# Patient Record
Sex: Female | Born: 1990 | Race: Black or African American | Hispanic: No | Marital: Single | Smoking: Current every day smoker
Health system: Southern US, Community
[De-identification: ages and names within clinical notes are randomized; demographics above are authoritative.]

## PROBLEM LIST (undated history)

## (undated) DIAGNOSIS — K219 Gastro-esophageal reflux disease without esophagitis: Secondary | ICD-10-CM

## (undated) DIAGNOSIS — Z794 Long term (current) use of insulin: Secondary | ICD-10-CM

## (undated) DIAGNOSIS — I10 Essential (primary) hypertension: Secondary | ICD-10-CM

## (undated) DIAGNOSIS — E119 Type 2 diabetes mellitus without complications: Secondary | ICD-10-CM

## (undated) DIAGNOSIS — Z720 Tobacco use: Secondary | ICD-10-CM

## (undated) HISTORY — DX: Type 2 diabetes mellitus without complications: E11.9

## (undated) HISTORY — DX: Long term (current) use of insulin: Z79.4

## (undated) HISTORY — DX: Morbid (severe) obesity due to excess calories: E66.01

## (undated) HISTORY — DX: Gastro-esophageal reflux disease without esophagitis: K21.9

## (undated) HISTORY — DX: Tobacco use: Z72.0

## (undated) HISTORY — DX: Essential (primary) hypertension: I10

---

## 2014-01-22 ENCOUNTER — Emergency Department: Payer: Self-pay | Admitting: Student

## 2016-01-19 ENCOUNTER — Ambulatory Visit: Payer: Self-pay | Admitting: Urology

## 2016-01-19 VITALS — BP 151/102 | HR 80 | Ht 66.0 in | Wt 258.0 lb

## 2016-01-19 DIAGNOSIS — Z0000 Encounter for general adult medical examination without abnormal findings: Secondary | ICD-10-CM

## 2016-01-19 MED ORDER — HYDROCHLOROTHIAZIDE 25 MG PO TABS: 25.0000 mg | ORAL_TABLET | Freq: Every day | ORAL | 0 refills | Status: DC

## 2016-01-19 NOTE — Progress Notes (Signed)
  Patient: Pamela Kemp Female    DOB: 04-13-1990   25 y.o.   MRN: [id number] Visit Date: 01/19/2016  Today's Provider: [facility]   Chief Complaint  Patient presents with  . New Patient (Initial Visit)    pt has not had a physical exam "for a long time", BG: 93   Subjective:    HPI Patient is wanting to establish with a PCP.  She has not had a PAP smear.  No pain with sexual activity, no vaginal discharge or itching.    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  She has not had a seizure since that time.    BP is high today.  She states her maternal grandmother and mother have HTN.  She has not had any chest pain, SOB, no problems with vision, no HA's, numbness or other signs of stroke.  No exercise.  Has cut down on fried foods.  Eats fruits occasionally.  Does not like vegetables.        Allergies  Allergen Reactions  . Amoxicillin    Previous Medications   No medications on file    Review of Systems  Social History  Substance Use Topics  . Smoking status: Current Every Day Smoker    Years: 8.00    Types: Cigars  . Smokeless tobacco: Never Used  . Alcohol use No   Objective:   BP (!) 151/102   Pulse 80   Ht 5\' 6"  (1.676 m)   Wt 258 lb (117 kg)   LMP  (Within Months) Comment: mid october  SpO2 96%   BMI 41.64 kg/m   Physical Exam Constitutional: Well nourished. Alert and oriented, No acute distress. HEENT: [place] AT, moist mucus membranes. Trachea midline, no masses. Cardiovascular: No clubbing, cyanosis, or edema. Respiratory: Normal respiratory effort, no increased work of breathing. GI: Abdomen is soft, non tender, non distended, no abdominal masses. Liver and spleen not palpable.  No hernias appreciated.  Stool sample for occult testing is not indicated.   GU: No CVA tenderness.  No bladder fullness or masses.   Skin: No rashes, bruises  or suspicious lesions. Lymph: No cervical or inguinal adenopathy. Neurologic: Grossly intact, no focal deficits, moving all 4 extremities. Psychiatric: Normal mood and affect.      Assessment & Plan:  1. HTN  - start HCTZ  - RTC in 2 weeks  - not pregnant in a relationship with female partner 2. Health maintenance  - obtain screening labs (HbgA1c, TSH, CMP, CBC, lipids) 3. Obese  - encourage exercise - 30 minutes every day  - given DASH diet  Flu shot given in the left deltiod lot #[id number]  Exp 07/2016         [facility]   [facility] [place]

## 2016-01-19 NOTE — Patient Instructions (Addendum)
DASH Eating Plan DASH stands for "Dietary Approaches to Stop Hypertension." The DASH eating plan is a healthy eating plan that has been shown to reduce high blood pressure (hypertension). Additional health benefits may include reducing the risk of type 2 diabetes mellitus, heart disease, and stroke. The DASH eating plan may also help with weight loss. What do I need to know about the DASH eating plan? For the DASH eating plan, you will follow these general guidelines:  Choose foods with less than 150 milligrams of sodium per serving (as listed on the food label).  Use salt-free seasonings or herbs instead of table salt or sea salt.  Check with your health care provider or pharmacist before using salt substitutes.  Eat lower-sodium products. These are often labeled as "low-sodium" or "no salt added."  Eat fresh foods. Avoid eating a lot of canned foods.  Eat more vegetables, fruits, and low-fat dairy products.  Choose whole grains. Look for the word "whole" as the first word in the ingredient list.  Choose fish and skinless chicken or turkey more often than red meat. Limit fish, poultry, and meat to 6 oz (170 g) each day.  Limit sweets, desserts, sugars, and sugary drinks.  Choose heart-healthy fats.  Eat more home-cooked food and less restaurant, buffet, and fast food.  Limit fried foods.  Do not fry foods. Cook foods using methods such as baking, boiling, grilling, and broiling instead.  When eating at a restaurant, ask that your food be prepared with less salt, or no salt if possible. What foods can I eat? Seek help from a dietitian for individual calorie needs. Grains  Whole grain or whole wheat bread. Brown rice. Whole grain or whole wheat pasta. Quinoa, bulgur, and whole grain cereals. Low-sodium cereals. Corn or whole wheat flour tortillas. Whole grain cornbread. Whole grain crackers. Low-sodium crackers. Vegetables  Fresh or frozen vegetables (raw, steamed, roasted, or  grilled). Low-sodium or reduced-sodium tomato and vegetable juices. Low-sodium or reduced-sodium tomato sauce and paste. Low-sodium or reduced-sodium canned vegetables. Fruits  All fresh, canned (in natural juice), or frozen fruits. Meat and Other Protein Products  Ground beef (85% or leaner), grass-fed beef, or beef trimmed of fat. Skinless chicken or turkey. Ground chicken or turkey. Pork trimmed of fat. All fish and seafood. Eggs. Dried beans, peas, or lentils. Unsalted nuts and seeds. Unsalted canned beans. Dairy  Low-fat dairy products, such as skim or 1% milk, 2% or reduced-fat cheeses, low-fat ricotta or cottage cheese, or plain low-fat yogurt. Low-sodium or reduced-sodium cheeses. Fats and Oils  Tub margarines without trans fats. Light or reduced-fat mayonnaise and salad dressings (reduced sodium). Avocado. Safflower, olive, or canola oils. Natural peanut or almond butter. Other  Unsalted popcorn and pretzels. The items listed above may not be a complete list of recommended foods or beverages. Contact your dietitian for more options.  What foods are not recommended? Grains  White bread. White pasta. White rice. Refined cornbread. Bagels and croissants. Crackers that contain trans fat. Vegetables  Creamed or fried vegetables. Vegetables in a cheese sauce. Regular canned vegetables. Regular canned tomato sauce and paste. Regular tomato and vegetable juices. Fruits  Canned fruit in light or heavy syrup. Fruit juice. Meat and Other Protein Products  Fatty cuts of meat. Ribs, chicken wings, bacon, sausage, bologna, salami, chitterlings, fatback, hot dogs, bratwurst, and packaged luncheon meats. Salted nuts and seeds. Canned beans with salt. Dairy  Whole or 2% milk, cream, half-and-half, and cream cheese. Whole-fat or sweetened yogurt. Full-fat cheeses   or blue cheese. Nondairy creamers and whipped toppings. Processed cheese, cheese spreads, or cheese curds. Condiments  Onion and garlic  salt, seasoned salt, table salt, and sea salt. Canned and packaged gravies. Worcestershire sauce. Tartar sauce. Barbecue sauce. Teriyaki sauce. Soy sauce, including reduced sodium. Steak sauce. Fish sauce. Oyster sauce. Cocktail sauce. Horseradish. Ketchup and mustard. Meat flavorings and tenderizers. Bouillon cubes. Hot sauce. Tabasco sauce. Marinades. Taco seasonings. Relishes. Fats and Oils  Butter, stick margarine, lard, shortening, ghee, and bacon fat. Coconut, palm kernel, or palm oils. Regular salad dressings. Other  Pickles and olives. Salted popcorn and pretzels. The items listed above may not be a complete list of foods and beverages to avoid. Contact your dietitian for more information.  Where can I find more information? National Heart, Lung, and Blood Institute: www.nhlbi.nih.gov/health/health-topics/topics/dash/ This information is not intended to replace advice given to you by your health care provider. Make sure you discuss any questions you have with your health care provider. Document Released: 01/25/2011 Document Revised: 07/14/2015 Document Reviewed: 12/10/2012 Elsevier Interactive Patient Education  2017 Elsevier Inc. Hypertension Hypertension is another name for high blood pressure. High blood pressure forces your heart to work harder to pump blood. A blood pressure reading has two numbers, which includes a higher number over a lower number (example: 110/72). Follow these instructions at home:  Have your blood pressure rechecked by your doctor.  Only take medicine as told by your doctor. Follow the directions carefully. The medicine does not work as well if you skip doses. Skipping doses also puts you at risk for problems.  Do not smoke.  Monitor your blood pressure at home as told by your doctor. Contact a doctor if:  You think you are having a reaction to the medicine you are taking.  You have repeat headaches or feel dizzy.  You have puffiness (swelling) in your  ankles.  You have trouble with your vision. Get help right away if:  You get a very bad headache and are confused.  You feel weak, numb, or faint.  You get chest or belly (abdominal) pain.  You throw up (vomit).  You cannot breathe very well. This information is not intended to replace advice given to you by your health care provider. Make sure you discuss any questions you have with your health care provider. Document Released: 07/25/2007 Document Revised: 07/14/2015 Document Reviewed: 11/28/2012 Elsevier Interactive Patient Education  2017 Elsevier Inc.  

## 2016-01-26 ENCOUNTER — Other Ambulatory Visit: Payer: Self-pay

## 2016-01-26 DIAGNOSIS — I10 Essential (primary) hypertension: Secondary | ICD-10-CM

## 2016-01-27 LAB — CBC WITH DIFFERENTIAL/PLATELET
BASOS: 0 %
Basophils Absolute: 0 10*3/uL (ref 0.0–0.2)
EOS (ABSOLUTE): 0 10*3/uL (ref 0.0–0.4)
Eos: 1 %
HEMATOCRIT: 40.9 % (ref 34.0–46.6)
HEMOGLOBIN: 13.1 g/dL (ref 11.1–15.9)
IMMATURE GRANS (ABS): 0 10*3/uL (ref 0.0–0.1)
Immature Granulocytes: 0 %
LYMPHS: 57 %
Lymphocytes Absolute: 2.6 10*3/uL (ref 0.7–3.1)
MCH: 26.3 pg — AB (ref 26.6–33.0)
MCHC: 32 g/dL (ref 31.5–35.7)
MCV: 82 fL (ref 79–97)
MONOCYTES: 5 %
Monocytes Absolute: 0.3 10*3/uL (ref 0.1–0.9)
NEUTROS ABS: 1.7 10*3/uL (ref 1.4–7.0)
Neutrophils: 37 %
Platelets: 186 10*3/uL (ref 150–379)
RBC: 4.98 x10E6/uL (ref 3.77–5.28)
RDW: 14.5 % (ref 12.3–15.4)
WBC: 4.6 10*3/uL (ref 3.4–10.8)

## 2016-01-27 LAB — LIPID PANEL
CHOLESTEROL TOTAL: 166 mg/dL (ref 100–199)
Chol/HDL Ratio: 5 ratio units — ABNORMAL HIGH (ref 0.0–4.4)
HDL: 33 mg/dL — AB (ref >39)
LDL Calculated: 91 mg/dL (ref 0–99)
TRIGLYCERIDES: 210 mg/dL — AB (ref 0–149)
VLDL CHOLESTEROL CAL: 42 mg/dL — AB (ref 5–40)

## 2016-01-27 LAB — COMPREHENSIVE METABOLIC PANEL
A/G RATIO: 1.1 — AB (ref 1.2–2.2)
ALT: 65 IU/L — AB (ref 0–32)
AST: 49 IU/L — ABNORMAL HIGH (ref 0–40)
Albumin: 4.2 g/dL (ref 3.5–5.5)
Alkaline Phosphatase: 77 IU/L (ref 39–117)
BUN/Creatinine Ratio: 16 (ref 9–23)
BUN: 10 mg/dL (ref 6–20)
Bilirubin Total: 0.3 mg/dL (ref 0.0–1.2)
CALCIUM: 9.6 mg/dL (ref 8.7–10.2)
CO2: 23 mmol/L (ref 18–29)
Chloride: 102 mmol/L (ref 96–106)
Creatinine, Ser: 0.64 mg/dL (ref 0.57–1.00)
GFR, EST AFRICAN AMERICAN: 143 mL/min/{1.73_m2} (ref >59)
GFR, EST NON AFRICAN AMERICAN: 124 mL/min/{1.73_m2} (ref >59)
Globulin, Total: 3.9 g/dL (ref 1.5–4.5)
Glucose: 110 mg/dL — ABNORMAL HIGH (ref 65–99)
POTASSIUM: 4.5 mmol/L (ref 3.5–5.2)
Sodium: 141 mmol/L (ref 134–144)
TOTAL PROTEIN: 8.1 g/dL (ref 6.0–8.5)

## 2016-01-27 LAB — HEMOGLOBIN A1C
ESTIMATED AVERAGE GLUCOSE: 146 mg/dL
Hgb A1c MFr Bld: 6.7 % — ABNORMAL HIGH (ref 4.8–5.6)

## 2016-01-27 LAB — TSH: TSH: 1.23 u[IU]/mL (ref 0.450–4.500)

## 2016-02-01 ENCOUNTER — Ambulatory Visit: Payer: Self-pay | Admitting: Ophthalmology

## 2016-02-02 ENCOUNTER — Ambulatory Visit: Payer: Self-pay | Admitting: Urology

## 2016-02-02 VITALS — BP 139/90 | HR 89 | Temp 98.9°F | Wt 253.0 lb

## 2016-02-02 DIAGNOSIS — I10 Essential (primary) hypertension: Secondary | ICD-10-CM

## 2016-02-02 DIAGNOSIS — E119 Type 2 diabetes mellitus without complications: Secondary | ICD-10-CM

## 2016-02-02 MED ORDER — METFORMIN HCL ER 500 MG PO TB24: 500.0000 mg | ORAL_TABLET | Freq: Every day | ORAL | 3 refills | Status: DC

## 2016-02-02 MED ORDER — HYDROCHLOROTHIAZIDE 25 MG PO TABS: 25.0000 mg | ORAL_TABLET | Freq: Every day | ORAL | 3 refills | Status: DC

## 2016-02-02 NOTE — Progress Notes (Signed)
Patient: Pamela Kemp Female    DOB: 1990-07-04   25 y.o.   MRN: [id number] Visit Date: 02/02/2016  Today's Provider: [facility]   Chief Complaint  Patient presents with  . Hypertension    follow up and bp check.    Subjective:    Hypertension  Pertinent negatives include no chest pain or headaches.   Patient is wanting to establish with a PCP.  She has not had a PAP smear.  No pain with sexual activity, no vaginal discharge or itching.    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  She has not had a seizure since that time.    BP is lower at today's vsit.  Last visit 151/102, today is 139/90.  She states her maternal grandmother and mother have HTN.  She has not had any chest pain, SOB, no problems with vision, no HA's, numbness or other signs of stroke.  Was able to walk 30 minutes every day last week.    Has cut down on fried foods.  Eats fruits occasionally.  Eating more vegetables.    She was found to have a HbgA1c at 6.7%.  Had eye exam on 02/02/2016     Allergies  Allergen Reactions  . Amoxicillin    Previous Medications   HYDROCHLOROTHIAZIDE (HYDRODIURIL) 25 MG TABLET    Take 1 tablet (25 mg total) by mouth daily.    Review of Systems  Constitutional: Positive for activity change. Negative for appetite change and chills.  HENT: Negative for congestion, dental problem and drooling.   Eyes: Negative for pain, discharge and itching.  Respiratory: Negative for apnea, choking and chest tightness.   Cardiovascular: Negative for chest pain and leg swelling.  Gastrointestinal: Negative for abdominal distention, abdominal pain and anal bleeding.  Endocrine: Negative for cold intolerance, heat intolerance and polydipsia.  Genitourinary: Negative for difficulty urinating, dyspareunia and dysuria.  Musculoskeletal: Negative for arthralgias, back pain and  gait problem.  Allergic/Immunologic: Negative for environmental allergies, food allergies and immunocompromised state.  Neurological: Negative for dizziness, facial asymmetry, light-headedness and headaches.  Hematological: Negative for adenopathy. Does not bruise/bleed easily.  Psychiatric/Behavioral: Negative for agitation and behavioral problems.    Social History  Substance Use Topics  . Smoking status: Current Every Day Smoker    Years: 8.00    Types: Cigars  . Smokeless tobacco: Never Used  . Alcohol use No   Objective:   BP 139/90   Pulse 89   Temp 98.9 F (37.2 C) (Oral)   Wt 253 lb (114.8 kg)   LMP 01/26/2016 (Approximate) Comment: first week of Dec.  BMI 40.84 kg/m   Physical Exam Constitutional: Obese. Well nourished. Alert and oriented, No acute distress. HEENT: [place] AT, moist mucus membranes. Trachea midline, no masses. Cardiovascular: No clubbing, cyanosis, or edema. Respiratory: Normal respiratory effort, no increased work of breathing. GI: Abdomen is soft, non tender, non distended, no abdominal masses. Liver and spleen not palpable.  No hernias appreciated.  Stool sample for occult testing is not indicated.   GU: No CVA tenderness.  No bladder fullness or masses.   Skin: No rashes, bruises or suspicious lesions. Lymph: No cervical or inguinal adenopathy. Neurologic: Grossly intact, no focal deficits, moving all 4 extremities. Psychiatric: Normal mood and affect.      Assessment & Plan:  1. HTN  - start HCTZ  -  B/P controlled; continue the HCTZ  - not pregnant in a relationship with female partner  2. DM  - patient started on metformin XR 500 mg   - [facility]  - RTC in one month  3. Obese  - encourage exercise - 30 minutes every day  - given DASH diet  [facility]   [facility] [place]

## 2016-02-10 ENCOUNTER — Telehealth: Payer: Self-pay

## 2016-02-10 NOTE — Telephone Encounter (Signed)
Called pt and gave results from [facility]. Pt verbalized understanding. Has appt already 12/28.

## 2016-02-10 NOTE — Telephone Encounter (Signed)
-----   Message from Jacelyn Pieah Doles-Johnson, NP sent at 01/31/2016  8:20 PM EST ----- Pt is diabetic. Make appointment for pt to come in to review labs and start medications.

## 2016-02-16 ENCOUNTER — Ambulatory Visit: Payer: Self-pay | Admitting: Adult Health Nurse Practitioner

## 2016-02-16 VITALS — BP 146/99 | HR 105 | Temp 98.0°F | Wt 258.0 lb

## 2016-02-16 DIAGNOSIS — E119 Type 2 diabetes mellitus without complications: Secondary | ICD-10-CM

## 2016-02-16 DIAGNOSIS — Z794 Long term (current) use of insulin: Secondary | ICD-10-CM

## 2016-02-16 HISTORY — DX: Long term (current) use of insulin: Z79.4

## 2016-02-16 HISTORY — DX: Type 2 diabetes mellitus without complications: E11.9

## 2016-02-16 LAB — POCT GLUCOSE (DEVICE FOR HOME USE): Glucose Fasting, POC: 122 mg/dL — AB (ref 70–99)

## 2016-02-16 NOTE — Progress Notes (Signed)
  Patient: Pamela Kemp Female    DOB: Mar 11, 1990   25 y.o.   MRN: [id number] Visit Date: 02/16/2016  Today's Provider: [facility]   Chief Complaint  Patient presents with  . paperwork    for plasma  . Diabetes   Subjective:    HPI     Needs paperwork filled out for plasma donations with pertinent medical diagnosis.   Reports taking medications as directed.   Allergies  Allergen Reactions  . Amoxicillin    Previous Medications   HYDROCHLOROTHIAZIDE (HYDRODIURIL) 25 MG TABLET    Take 1 tablet (25 mg total) by mouth daily.   METFORMIN (GLUCOPHAGE XR) 500 MG 24 HR TABLET    Take 1 tablet (500 mg total) by mouth daily with breakfast.    Review of Systems  All other systems reviewed and are negative.   Social History  Substance Use Topics  . Smoking status: Current Every Day Smoker    Years: 8.00    Types: Cigars  . Smokeless tobacco: Never Used  . Alcohol use No   Objective:   BP (!) 146/99 (BP Location: Left Arm, Patient Position: Sitting, Cuff Size: Large)   Pulse (!) 105   Temp 98 F (36.7 C) (Oral)   Wt 258 lb (117 kg)   LMP 01/26/2016 (Approximate) Comment: first week of Dec.  BMI 41.64 kg/m   Physical Exam  Constitutional: She is oriented to person, place, and time. She appears well-developed and well-nourished.  HENT:  Head: Normocephalic and atraumatic.  Eyes: Pupils are equal, round, and reactive to light.  Cardiovascular: Normal rate, regular rhythm and normal heart sounds.   Pulmonary/Chest: Effort normal and breath sounds normal.  Abdominal: Soft. Bowel sounds are normal.  Neurological: She is alert and oriented to person, place, and time.  Skin: Skin is warm and dry.  Vitals reviewed.       Assessment & Plan:         Pt to FU in January for DM and HTN.  BP continues to be elevated today. Will reevaluate at next OV.  Encouraged diet modification and exercise.   Will have paper filled out within a week.    [facility]   [facility] [place]

## 2016-03-08 ENCOUNTER — Ambulatory Visit: Payer: Self-pay

## 2016-03-13 ENCOUNTER — Ambulatory Visit: Payer: Self-pay | Admitting: Adult Health Nurse Practitioner

## 2016-03-13 VITALS — BP 138/98 | HR 96 | Temp 97.1°F | Wt 354.0 lb

## 2016-03-13 DIAGNOSIS — E119 Type 2 diabetes mellitus without complications: Secondary | ICD-10-CM

## 2016-03-13 DIAGNOSIS — K219 Gastro-esophageal reflux disease without esophagitis: Secondary | ICD-10-CM

## 2016-03-13 DIAGNOSIS — Z794 Long term (current) use of insulin: Principal | ICD-10-CM

## 2016-03-13 HISTORY — DX: Gastro-esophageal reflux disease without esophagitis: K21.9

## 2016-03-13 LAB — POCT GLUCOSE (DEVICE FOR HOME USE): POC Glucose: 176 mg/dl — AB (ref 70–99)

## 2016-03-13 MED ORDER — AMLODIPINE BESYLATE 5 MG PO TABS: 5.0000 mg | ORAL_TABLET | Freq: Every day | ORAL | 3 refills | Status: DC

## 2016-03-13 MED ORDER — GLIPIZIDE 5 MG PO TABS: 5.0000 mg | ORAL_TABLET | Freq: Every day | ORAL | 2 refills | Status: DC

## 2016-03-13 NOTE — Progress Notes (Signed)
  Patient: Pamela Kemp Female    DOB: 01-17-1991   25 y.o.   MRN: [id number] Visit Date: 03/13/2016  Today's Provider: Jacelyn Pieah Doles-Johnson, NP   Chief Complaint  Patient presents with  . Medication Reaction    metformin makes pt feel sick   . Heartburn   Subjective:    HPI  BP still elevated.  Pt states that she checks BP at home but does not remember readings.  Endorses low sodium diet.    States that Metformin is giving diarrhea and nausea.  She has been on the medication for 2 months.  Pt states that she just feels "sick" all the time after taking the medication. Not checking sugar at home.  A1C 6.7.    States she is having heartburn all the time.    Allergies  Allergen Reactions  . Amoxicillin    Previous Medications   HYDROCHLOROTHIAZIDE (HYDRODIURIL) 25 MG TABLET    Take 1 tablet (25 mg total) by mouth daily.   METFORMIN (GLUCOPHAGE XR) 500 MG 24 HR TABLET    Take 1 tablet (500 mg total) by mouth daily with breakfast.    Review of Systems  All other systems reviewed and are negative.   Social History  Substance Use Topics  . Smoking status: Current Every Day Smoker    Years: 8.00    Types: Cigars  . Smokeless tobacco: Never Used  . Alcohol use No   Objective:   BP (!) 138/98   Pulse 96   Temp 97.1 F (36.2 C)   Wt (!) 354 lb (160.6 kg)   BMI 57.14 kg/m   Physical Exam  Constitutional: She is oriented to person, place, and time. She appears well-developed and well-nourished.  HENT:  Head: Normocephalic and atraumatic.  Cardiovascular: Normal rate, regular rhythm and normal heart sounds.   Pulmonary/Chest: Effort normal and breath sounds normal.  Abdominal: Soft. Bowel sounds are normal.  Neurological: She is alert and oriented to person, place, and time.  Skin: Skin is warm and dry.  Vitals reviewed.       Assessment & Plan:         HTN:  Add Norvasc 5 mg daily.  Continue HCTZ.  FU in 1 month for BP check.   DM:  Stop  Metformin.  Start glipizide 5mg .  Discussed hypoglycemia.   GERD:  Zantac samples given.   FU in 4 weeks for DM and BP medication changes.    Jacelyn Pieah Doles-Johnson, NP   [facility] [place] [place]

## 2016-04-03 ENCOUNTER — Ambulatory Visit: Payer: Self-pay | Admitting: Family Medicine

## 2016-04-03 VITALS — BP 147/104 | HR 89 | Wt 361.0 lb

## 2016-04-03 DIAGNOSIS — Z794 Long term (current) use of insulin: Secondary | ICD-10-CM

## 2016-04-03 DIAGNOSIS — I10 Essential (primary) hypertension: Secondary | ICD-10-CM | POA: Insufficient documentation

## 2016-04-03 DIAGNOSIS — M2142 Flat foot [pes planus] (acquired), left foot: Secondary | ICD-10-CM

## 2016-04-03 DIAGNOSIS — E119 Type 2 diabetes mellitus without complications: Secondary | ICD-10-CM

## 2016-04-03 DIAGNOSIS — Z720 Tobacco use: Secondary | ICD-10-CM

## 2016-04-03 DIAGNOSIS — M79605 Pain in left leg: Principal | ICD-10-CM

## 2016-04-03 DIAGNOSIS — M79604 Pain in right leg: Secondary | ICD-10-CM | POA: Insufficient documentation

## 2016-04-03 DIAGNOSIS — M79672 Pain in left foot: Principal | ICD-10-CM

## 2016-04-03 DIAGNOSIS — M79671 Pain in right foot: Principal | ICD-10-CM

## 2016-04-03 DIAGNOSIS — M2140 Flat foot [pes planus] (acquired), unspecified foot: Secondary | ICD-10-CM | POA: Insufficient documentation

## 2016-04-03 DIAGNOSIS — M2141 Flat foot [pes planus] (acquired), right foot: Secondary | ICD-10-CM

## 2016-04-03 HISTORY — DX: Morbid (severe) obesity due to excess calories: E66.01

## 2016-04-03 LAB — GLUCOSE, POCT (MANUAL RESULT ENTRY): POC Glucose: 83 mg/dl (ref 70–99)

## 2016-04-03 MED ORDER — BLOOD GLUCOSE MONITOR KIT: PACK | 0 refills | Status: AC

## 2016-04-03 MED ORDER — AMLODIPINE BESYLATE 10 MG PO TABS: 10.0000 mg | ORAL_TABLET | Freq: Every day | ORAL | 5 refills | Status: DC

## 2016-04-03 NOTE — Progress Notes (Signed)
BP (!) 147/104   Pulse 89   Wt (!) 361 lb (163.7 kg)   BMI 58.27 kg/m    Subjective:    Patient ID: Pamela Kemp, female    DOB: [birth year], 26 y.o.   MRN: [id number]  HPI: Pamela Kemp is a 26 y.o. female  No chief complaint on file.  Has been having issues with leg pain; going on for months; one ankle, then the other; can be at the same time; laying down helps the pain; standing on it makes it worse; up and down stairs is worse too; she has tried icy hot on her ankles; it will work for a few hours; pain can be there even first thing in the morning; few episodes of sprained ankles; no serious injuries; no xrays done  HTN; taking her BP; mother helping and checking her numbers; she will start writing it down; did have low pressure last month; not adding salt to food; avoiding decongestants  Relevant past medical, surgical, family and social history reviewed Past Medical History:  Diagnosis Date  . Gastroesophageal reflux disease without esophagitis 03/13/2016  . Morbid obesity ([place]) 04/03/2016  . Tobacco use 04/08/2016  . Type 2 diabetes mellitus without complication, with long-term current use of insulin ([place]) 02/16/2016    History reviewed. No pertinent surgical history.  No surgeries Family History  Problem Relation Age of Onset  . Hypertension Mother   . Diabetes Mother   . Multiple sclerosis Father     Social History  Substance Use Topics  . Smoking status: Current Every Day Smoker    Years: 8.00    Types: Cigars  . Smokeless tobacco: Never Used  . Alcohol use No   Interim medical history since last visit reviewed. Allergies and medications reviewed  Review of Systems Per HPI unless specifically indicated above     Objective:    BP (!) 147/104   Pulse 89   Wt (!) 361 lb (163.7 kg)   BMI 58.27 kg/m   Wt Readings from Last 3 Encounters:  04/03/16 (!) 361 lb (163.7 kg)  03/13/16 (!) 354 lb (160.6 kg)  02/16/16 258 lb (117 kg)    Physical  Exam  Constitutional: She appears well-developed and well-nourished.  Morbid obesity; significant recent weight gain over last several months noted  HENT:  Mouth/Throat: Mucous membranes are normal.  Eyes: EOM are normal. No scleral icterus.  Cardiovascular: Normal rate and regular rhythm.   Pulmonary/Chest: Effort normal and breath sounds normal.  Musculoskeletal:       Right foot: There is deformity (pes planus).       Left foot: There is deformity (pes planus).  Psychiatric: She has a normal mood and affect. Her behavior is normal.   Results for orders placed or performed in visit on 04/03/16  POCT Glucose (CBG)  Result Value Ref Range   POC Glucose 83 70 - 99 mg/dl      Assessment & Plan:   Problem List Items Addressed This Visit      Cardiovascular and Mediastinum   Benign hypertension    Increase CCB; try DASH guidelines; weight loss important; see AVS      Relevant Medications   amLODipine (NORVASC) 10 MG tablet     Endocrine   Type 2 diabetes mellitus without complication, with long-term current use of insulin ([facility])   Relevant Orders   POCT Glucose (CBG) (Completed)     Other   Tobacco use    Encouraged her to  quit smoking      Pes planus    Refer to podiatrist      Relevant Orders   Ambulatory referral to Podiatry   Morbid obesity ([place])    I am here to help; encouraged her to move; stay hydrated; encouragement given; see AVS; patient seen too late this evening to get labs; I recommend checking thyroid stimulating hormone at next visit if not recently done      Bilateral leg and foot pain - Primary    Refer to podiatrist; I am sure that her morbid obesity plays a role in her foot pain; encouraged weight loss      Relevant Orders   Ambulatory referral to Podiatry      Follow up plan: Return in about 3 weeks (around 04/24/2016) for blood pressure.  An after-visit summary was printed and given to the patient at [facility].  Please see the patient  instructions which may contain other information and recommendations beyond what is mentioned above in the assessment and plan.  Meds ordered this encounter  Medications  . amLODipine (NORVASC) 10 MG tablet    Sig: Take 1 tablet (10 mg total) by mouth daily.    Dispense:  30 tablet    Refill:  5  . blood glucose meter kit and supplies KIT    Sig: Dispense based on patient and insurance preference. Use one time daily as directed.; E11.9    Dispense:  1 each    Refill:  0    Order Specific Question:   Number of strips    Answer:   100    Order Specific Question:   Number of lancets    Answer:   100    Orders Placed This Encounter  Procedures  . Ambulatory referral to Podiatry  . POCT Glucose (CBG)

## 2016-04-03 NOTE — Patient Instructions (Addendum)
I do encourage you to quit smoking Call [phone] to sign up for smoking cessation classes You can call 1-800-QUIT-NOW to talk with a smoking cessation coach Check out the information at familydoctor.org entitled "Nutrition for Weight Loss: What You Need to Know about Fad Diets" Try to lose between 1-2 pounds per week by taking in fewer calories and burning off more calories You can succeed by limiting portions, limiting foods dense in calories and fat, becoming more active, and drinking 8 glasses of water a day (64 ounces) Don't skip meals, especially breakfast, as skipping meals may alter your metabolism Do not use over-the-counter weight loss pills or gimmicks that claim rapid weight loss A healthy BMI (or body mass index) is between 18.5 and 24.9 You can calculate your ideal BMI at the NIH website [employer]huhttp://www.nhlbi.nih.gov/health/educational/lose_wt/BMI/bmicalc.htm Please let us know how we can help Wear good supportive shoes Increase amlodipine from 5 to 10 mg daily Check sugars once a day

## 2016-04-03 NOTE — Assessment & Plan Note (Addendum)
Increase CCB; try DASH guidelines; weight loss important; see AVS

## 2016-04-03 NOTE — Assessment & Plan Note (Addendum)
Refer to podiatrist; I am sure that her morbid obesity plays a role in her foot pain; encouraged weight loss

## 2016-04-03 NOTE — Assessment & Plan Note (Signed)
Refer to podiatrist 

## 2016-04-03 NOTE — Assessment & Plan Note (Addendum)
I am here to help; encouraged her to move; stay hydrated; encouragement given; see AVS; patient seen too late this evening to get labs; I recommend checking thyroid stimulating hormone at next visit if not recently done

## 2016-04-08 ENCOUNTER — Encounter: Payer: Self-pay | Admitting: Family Medicine

## 2016-04-08 DIAGNOSIS — Z720 Tobacco use: Secondary | ICD-10-CM

## 2016-04-08 HISTORY — DX: Tobacco use: Z72.0

## 2016-04-08 NOTE — Assessment & Plan Note (Signed)
Encouraged her to quit smoking.  ?

## 2016-04-11 ENCOUNTER — Telehealth: Payer: Self-pay

## 2016-04-11 NOTE — Telephone Encounter (Signed)
Called pt about referral to the lifestyle clinic. Pt is still interested in going. I have emailed the director to let her know she is still interested. I also went over charity care with pt. Pt verbalized understanding.

## 2016-04-17 ENCOUNTER — Ambulatory Visit: Payer: Self-pay

## 2016-04-19 ENCOUNTER — Encounter: Payer: Medicaid Other | Attending: Urology | Admitting: *Deleted

## 2016-04-19 ENCOUNTER — Encounter: Payer: Self-pay | Admitting: *Deleted

## 2016-04-19 VITALS — BP 144/100 | Ht 68.0 in | Wt 361.5 lb

## 2016-04-19 DIAGNOSIS — Z713 Dietary counseling and surveillance: Secondary | ICD-10-CM | POA: Insufficient documentation

## 2016-04-19 DIAGNOSIS — E119 Type 2 diabetes mellitus without complications: Secondary | ICD-10-CM | POA: Insufficient documentation

## 2016-04-19 NOTE — Patient Instructions (Signed)
Check blood sugars 1 x day before breakfast OR 2 hrs after supper every day  Exercise:  Continue walking for  60-120 minutes 3 days a week  Eat 3 meals day, 1-2  snacks a day Space meals 4-6 hours apart Avoid sugar sweetened drinks (sports drinks, juices)  Bring blood sugar records to the next class  Return for classes on:

## 2016-04-19 NOTE — Progress Notes (Signed)
Diabetes Self-Management Education  Visit Type: First/Initial  Appt. Start Time: 1335 Appt. End Time:  1430  04/19/2016  Ms. Pamela Kemp, identified by name and date of birth, is a 26 y.o. female with a diagnosis of Diabetes: Type 2.   ASSESSMENT  Blood pressure (!) 144/100, height 5\' 8"  (1.727 m), weight (!) 361 lb 8 oz (164 kg), last menstrual period 04/05/2016. Body mass index is 54.97 kg/m.      Diabetes Self-Management Education - 04/19/16 1451      Visit Information   Visit Type First/Initial     Initial Visit   Diabetes Type Type 2   Are you taking your medications as prescribed? Yes   Date Diagnosed Nov 2017     Health Coping   How would you rate your overall health? Fair     Psychosocial Assessment   Patient Belief/Attitude about Diabetes Other (comment)  "sometimes tired and don't want to do anything"   Self-care barriers None   Self-management support Doctor's office   Patient Concerns Nutrition/Meal planning;Weight Control;Glycemic Control   Special Needs None   Preferred Learning Style Auditory   Learning Readiness Ready   How often do you need to have someone help you when you read instructions, pamphlets, or other written materials from your doctor or pharmacy? 1 - Never   What is the last grade level you completed in school? 12th     Pre-Education Assessment   Patient understands the diabetes disease and treatment process. Needs Instruction   Patient understands incorporating nutritional management into lifestyle. Needs Instruction   Patient undertands incorporating physical activity into lifestyle. Needs Instruction   Patient understands using medications safely. Needs Instruction   Patient understands monitoring blood glucose, interpreting and using results Needs Review   Patient understands prevention, detection, and treatment of acute complications. Needs Instruction   Patient understands prevention, detection, and treatment of chronic  complications. Needs Instruction   Patient understands how to develop strategies to address psychosocial issues. Needs Instruction   Patient understands how to develop strategies to promote health/change behavior. Needs Instruction     Complications   Last HgB A1C per patient/outside source 6.7 %  01/26/16   How often do you check your blood sugar? 1-2 times/day   Fasting Blood glucose range (mg/dL) 40-981;191-47870-129;130-179  Pt reports FBG's range from 86-169 mg/dL.    Have you had a dilated eye exam in the past 12 months? Yes   Have you had a dental exam in the past 12 months? No  no insurance   Are you checking your feet? No     Dietary Intake   Breakfast egg whites and toast; oatmeal; grits and occasional bacon   Lunch noodles, sandwiches - peanut butter   Dinner baked chicken or fish or pork, green beans, potaotes, corn   Beverage(s) water, Gatorade, occasional diet soda, apple juice     Exercise   Exercise Type Light (walking / raking leaves)   How many days per week to you exercise? 3   How many minutes per day do you exercise? 120   Total minutes per week of exercise 360     Patient Education   Previous Diabetes Education No   Disease state  Definition of diabetes, type 1 and 2, and the diagnosis of diabetes   Nutrition management  Role of diet in the treatment of diabetes and the relationship between the three main macronutrients and blood glucose level;Reviewed blood glucose goals for pre and post meals and  how to evaluate the patients' food intake on their blood glucose level.   Physical activity and exercise  Role of exercise on diabetes management, blood pressure control and cardiac health.   Medications Reviewed patients medication for diabetes, action, purpose, timing of dose and side effects.   Monitoring Purpose and frequency of SMBG.;Taught/discussed recording of test results and interpretation of SMBG.;Identified appropriate SMBG and/or A1C goals.   Chronic complications  Relationship between chronic complications and blood glucose control; Pt denied headache, chest pain, numbness or tingling. Instructed her to dial 911 if symptoms occur due to high BP. She reports [facility] is aware and has changed some medication dosages. She has appt next week.    Psychosocial adjustment Identified and addressed patients feelings and concerns about diabetes     Individualized Goals (developed by patient)   Reducing Risk Improve blood sugars Lose weight     Outcomes   Expected Outcomes Demonstrated interest in learning. Expect positive outcomes   Future DMSE 4-6 wks      Individualized Plan for Diabetes Self-Management Training:   Learning Objective:  Patient will have a greater understanding of diabetes self-management. Patient education plan is to attend individual and/or group sessions per assessed needs and concerns.   Plan:   Patient Instructions  Check blood sugars 1 x day before breakfast OR 2 hrs after supper every day Exercise:  Continue walking for  60-120 minutes 3 days a week Eat 3 meals day, 1-2  snacks a day Space meals 4-6 hours apart Avoid sugar sweetened drinks (sports drinks, juices) Bring blood sugar records to the next class  Expected Outcomes:  Demonstrated interest in learning. Expect positive outcomes  Education material provided:  General Meal Planning Guidelines Simple Meal Plan  If problems or questions, patient to contact team via:  Sharion Settler, RN, CCM, CDE [phone]  Future DSME appointment: 4-6 wks  May 17, 2016 for Diabetes Class 1

## 2016-04-20 ENCOUNTER — Ambulatory Visit: Payer: Medicaid Other | Admitting: *Deleted

## 2016-04-24 ENCOUNTER — Ambulatory Visit: Payer: Self-pay | Admitting: Urology

## 2016-04-24 ENCOUNTER — Ambulatory Visit: Payer: Self-pay | Admitting: Podiatry

## 2016-04-24 VITALS — BP 154/100

## 2016-04-24 DIAGNOSIS — Z0130 Encounter for examination of blood pressure without abnormal findings: Secondary | ICD-10-CM

## 2016-04-24 NOTE — Progress Notes (Unsigned)
No complaints

## 2016-05-01 ENCOUNTER — Ambulatory Visit: Payer: Self-pay

## 2016-05-14 ENCOUNTER — Telehealth: Payer: Self-pay | Admitting: *Deleted

## 2016-05-14 NOTE — Telephone Encounter (Signed)
Phone call to follow up with patient about completing the [facility] paperwork for [facility] so she would not be charged for Diabetes classes. Her first class is Thursday 05/17/16. She reports that she hasn't done that yet. She was seen by us on 04/19/16 and instructed about this paperwork. She reports she will call [facility] and call back if she can't come to class on Thursday.

## 2016-05-15 ENCOUNTER — Ambulatory Visit: Payer: Self-pay | Admitting: Podiatry

## 2016-05-16 ENCOUNTER — Telehealth: Payer: Self-pay | Admitting: *Deleted

## 2016-05-16 NOTE — Telephone Encounter (Signed)
Received voice mail from patient that she would not be coming to Diabetes class tomorrow. Called and spoke with her. She hasn't heard back from [facility] and doesn't have paperwork complete to attend classes without cost. Instructed her to call back once she has talked with [facility] and we can reschedule classes. If no response in a few weeks, will discharge.

## 2016-05-17 ENCOUNTER — Ambulatory Visit: Payer: Medicaid Other

## 2016-05-23 ENCOUNTER — Other Ambulatory Visit: Payer: Self-pay

## 2016-05-23 DIAGNOSIS — I10 Essential (primary) hypertension: Secondary | ICD-10-CM

## 2016-05-23 MED ORDER — HYDROCHLOROTHIAZIDE 25 MG PO TABS: 25.0000 mg | ORAL_TABLET | Freq: Every day | ORAL | 0 refills | Status: DC

## 2016-05-23 MED ORDER — GLIPIZIDE 5 MG PO TABS
5.0000 mg | ORAL_TABLET | Freq: Every day | ORAL | 0 refills | Status: DC
Start: 2016-05-23 — End: 2016-06-26

## 2016-05-24 ENCOUNTER — Ambulatory Visit: Payer: Medicaid Other

## 2016-05-31 ENCOUNTER — Ambulatory Visit: Payer: Medicaid Other

## 2016-06-04 ENCOUNTER — Encounter: Payer: Self-pay | Admitting: *Deleted

## 2016-06-10 ENCOUNTER — Emergency Department: Payer: Medicaid Other

## 2016-06-10 ENCOUNTER — Encounter: Payer: Self-pay | Admitting: Emergency Medicine

## 2016-06-10 ENCOUNTER — Emergency Department
Admission: EM | Admit: 2016-06-10 | Discharge: 2016-06-11 | Disposition: A | Discharge status: Home / Self Care | Payer: Medicaid Other | Attending: Emergency Medicine | Admitting: Emergency Medicine

## 2016-06-10 DIAGNOSIS — E119 Type 2 diabetes mellitus without complications: Secondary | ICD-10-CM | POA: Insufficient documentation

## 2016-06-10 DIAGNOSIS — F17290 Nicotine dependence, other tobacco product, uncomplicated: Secondary | ICD-10-CM | POA: Insufficient documentation

## 2016-06-10 DIAGNOSIS — J328 Other chronic sinusitis: Secondary | ICD-10-CM | POA: Insufficient documentation

## 2016-06-10 DIAGNOSIS — R51 Headache: Secondary | ICD-10-CM

## 2016-06-10 DIAGNOSIS — R519 Headache, unspecified: Secondary | ICD-10-CM

## 2016-06-10 DIAGNOSIS — Z7984 Long term (current) use of oral hypoglycemic drugs: Secondary | ICD-10-CM | POA: Insufficient documentation

## 2016-06-10 DIAGNOSIS — J329 Chronic sinusitis, unspecified: Secondary | ICD-10-CM

## 2016-06-10 DIAGNOSIS — I10 Essential (primary) hypertension: Secondary | ICD-10-CM | POA: Insufficient documentation

## 2016-06-10 LAB — CBC WITH DIFFERENTIAL/PLATELET
Basophils Absolute: 0 10*3/uL (ref 0–0.1)
Basophils Relative: 0 %
Eosinophils Absolute: 0.1 10*3/uL (ref 0–0.7)
Eosinophils Relative: 1 %
HEMATOCRIT: 41.7 % (ref 35.0–47.0)
Hemoglobin: 13.7 g/dL (ref 12.0–16.0)
LYMPHS ABS: 2.5 10*3/uL (ref 1.0–3.6)
LYMPHS PCT: 34 %
MCH: 26.5 pg (ref 26.0–34.0)
MCHC: 32.9 g/dL (ref 32.0–36.0)
MCV: 80.6 fL (ref 80.0–100.0)
MONO ABS: 0.7 10*3/uL (ref 0.2–0.9)
MONOS PCT: 10 %
Neutro Abs: 4.2 10*3/uL (ref 1.4–6.5)
Neutrophils Relative %: 55 %
Platelets: 191 10*3/uL (ref 150–440)
RBC: 5.18 MIL/uL (ref 3.80–5.20)
RDW: 13.9 % (ref 11.5–14.5)
WBC: 7.5 10*3/uL (ref 3.6–11.0)

## 2016-06-10 LAB — URINALYSIS, COMPLETE (UACMP) WITH MICROSCOPIC
Bilirubin Urine: NEGATIVE
GLUCOSE, UA: NEGATIVE mg/dL
Ketones, ur: NEGATIVE mg/dL
NITRITE: NEGATIVE
PH: 6 (ref 5.0–8.0)
PROTEIN: NEGATIVE mg/dL
Specific Gravity, Urine: 1.015 (ref 1.005–1.030)

## 2016-06-10 LAB — COMPREHENSIVE METABOLIC PANEL
ALT: 43 U/L (ref 14–54)
AST: 35 U/L (ref 15–41)
Albumin: 4.1 g/dL (ref 3.5–5.0)
Alkaline Phosphatase: 73 U/L (ref 38–126)
Anion gap: 9 (ref 5–15)
BILIRUBIN TOTAL: 0.5 mg/dL (ref 0.3–1.2)
BUN: 6 mg/dL (ref 6–20)
CHLORIDE: 107 mmol/L (ref 101–111)
CO2: 23 mmol/L (ref 22–32)
CREATININE: 0.61 mg/dL (ref 0.44–1.00)
Calcium: 9.1 mg/dL (ref 8.9–10.3)
Glucose, Bld: 89 mg/dL (ref 65–99)
POTASSIUM: 3.8 mmol/L (ref 3.5–5.1)
Sodium: 139 mmol/L (ref 135–145)
TOTAL PROTEIN: 8.5 g/dL — AB (ref 6.5–8.1)

## 2016-06-10 LAB — PREGNANCY, URINE: Preg Test, Ur: NEGATIVE

## 2016-06-10 MED ORDER — LEVOFLOXACIN 500 MG PO TABS: 500.0000 mg | ORAL_TABLET | Freq: Every day | ORAL | 0 refills | Status: AC

## 2016-06-10 MED ORDER — DIPHENHYDRAMINE HCL 50 MG/ML IJ SOLN
25.0000 mg | Freq: Once | INTRAMUSCULAR | Status: AC
  Administered 2016-06-10: 25 mg via INTRAVENOUS
  Filled 2016-06-10: qty 1

## 2016-06-10 MED ORDER — OXYMETAZOLINE HCL 0.05 % NA SOLN: 2.0000 | Freq: Two times a day (BID) | NASAL | 2 refills | Status: DC

## 2016-06-10 MED ORDER — SODIUM CHLORIDE 0.9 % IV SOLN
Freq: Once | INTRAVENOUS | Status: AC
  Administered 2016-06-10: 21:00:00 via INTRAVENOUS

## 2016-06-10 MED ORDER — KETOROLAC TROMETHAMINE 30 MG/ML IJ SOLN
30.0000 mg | Freq: Once | INTRAMUSCULAR | Status: AC
  Administered 2016-06-10: 30 mg via INTRAVENOUS
  Filled 2016-06-10: qty 1

## 2016-06-10 MED ORDER — IBUPROFEN 800 MG PO TABS: 800.0000 mg | ORAL_TABLET | Freq: Three times a day (TID) | ORAL | 0 refills | Status: DC | PRN

## 2016-06-10 MED ORDER — DEXTROSE 5 % IV SOLN
1.0000 g | Freq: Once | INTRAVENOUS | Status: AC
  Administered 2016-06-11: 1 g via INTRAVENOUS

## 2016-06-10 MED ORDER — METOCLOPRAMIDE HCL 5 MG/ML IJ SOLN
10.0000 mg | Freq: Once | INTRAMUSCULAR | Status: AC
  Administered 2016-06-10: 10 mg via INTRAVENOUS
  Filled 2016-06-10: qty 2

## 2016-06-10 NOTE — ED Triage Notes (Signed)
Pt to rm 10 via EMS from home, report HA x 3 days, described as pressure in front and sides of head.  Reports some sinus congestion.  Reports photosensitivity and sensitivity to sound.  Denies vision changes, numbness, and nausea.  Denies hx of headaches.

## 2016-06-10 NOTE — ED Provider Notes (Signed)
[facility] Emergency Department Provider Note       Time seen: ----------------------------------------- 9:13 PM on 06/10/2016 -----------------------------------------     I have reviewed the triage vital signs and the nursing notes.   HISTORY   Chief Complaint Headache    HPI Pamela Kemp is a 26 y.o. female who presents to the ED for headache for the last 3 days. Patient describes the headache as pressure in the front and sides of her head. She has had some recent sinus congestion and describes light and sound sensitivity. She denies any changes in her vision, numbness, weakness or other complaints. Patient states she does not normally have a history of headaches. She denies fevers, chills.   Past Medical History:  Diagnosis Date  . Gastroesophageal reflux disease without esophagitis 03/13/2016  . Hypertension   . Morbid obesity (HCC) 04/03/2016  . Tobacco use 04/08/2016  . Type 2 diabetes mellitus without complication, with long-term current use of insulin (HCC) 02/16/2016    Patient Active Problem List   Diagnosis Date Noted  . Tobacco use 04/08/2016  . Morbid obesity (HCC) 04/03/2016  . Bilateral leg and foot pain 04/03/2016  . Pes planus 04/03/2016  . Benign hypertension 04/03/2016  . Gastroesophageal reflux disease without esophagitis 03/13/2016  . Type 2 diabetes mellitus without complication, with long-term current use of insulin (HCC) 02/16/2016    History reviewed. No pertinent surgical history.  Allergies Amoxicillin  Social History Social History  Substance Use Topics  . Smoking status: Current Every Day Smoker    Years: 8.00    Types: Cigars  . Smokeless tobacco: Never Used     Comment: 2 cigars per day  . Alcohol use 0.6 oz/week    1 Standard drinks or equivalent per week     Comment: wine coolers   Review of Systems Constitutional: Negative for fever. Cardiovascular: Negative for chest pain. Respiratory:  Negative for shortness of breath. Gastrointestinal: Negative for abdominal pain, vomiting and diarrhea. Genitourinary: Negative for dysuria. Musculoskeletal: Negative for back pain. Skin: Negative for rash. Neurological:Positive for headache  10-point ROS otherwise negative.  ____________________________________________   PHYSICAL EXAM:  VITAL SIGNS: ED Triage Vitals [06/10/16 2044]  Enc Vitals Group     BP (!) 144/93     Pulse Rate 78     Resp 16     Temp 97.9 F (36.6 C)     Temp Source Oral     SpO2 98 %     Weight (!) 349 lb (158.3 kg)     Height  (1.727 m)     Head Circumference      Peak Flow      Pain Score 10     Pain Loc      Pain Edu?      Excl. in GC?     Constitutional: Alert and oriented. Tearful, mild distress Eyes: Conjunctivae are normal. PERRL. Normal extraocular movements. ENT   Head: Normocephalic and atraumatic.   Nose: No congestion/rhinnorhea.   Mouth/Throat: Mucous membranes are moist.   Neck: No stridor. Cardiovascular: Normal rate, regular rhythm. No murmurs, rubs, or gallops. Respiratory: Normal respiratory effort without tachypnea nor retractions. Breath sounds are clear and equal bilaterally. No wheezes/rales/rhonchi. Gastrointestinal: Soft and nontender. Normal bowel sounds Musculoskeletal: Nontender with normal range of motion in extremities. No lower extremity tenderness nor edema. Neurologic:  Normal speech and language. No gross focal neurologic deficits are appreciated.  Skin:  Skin is warm, dry and intact. No rash  noted. Psychiatric: Mood and affect are normal. Speech and behavior are normal.  ____________________________________________  ED COURSE:  Pertinent labs & imaging results that were available during my care of the patient were reviewed by me and considered in my medical decision making (see chart for details). Patient presents for headaches, we will assess with labs and imaging as indicated. Clinical  Course as of Jun 10 2336  Wynelle Link Jun 10, 2016  2247 Patient reports improving headache at this time.   [JW]    Clinical Course User Index [JW] Emily Filbert, MD   Procedures ____________________________________________   LABS (pertinent positives/negatives)  Labs Reviewed  COMPREHENSIVE METABOLIC PANEL - Abnormal; Notable for the following:       Result Value   Total Protein 8.5 (*)    All other components within normal limits  URINALYSIS, COMPLETE (UACMP) WITH MICROSCOPIC - Abnormal; Notable for the following:    Color, Urine YELLOW (*)    APPearance HAZY (*)    Hgb urine dipstick MODERATE (*)    Leukocytes, UA SMALL (*)    Bacteria, UA MANY (*)    Squamous Epithelial / LPF 6-30 (*)    All other components within normal limits  CBC WITH DIFFERENTIAL/PLATELET  PREGNANCY, URINE    RADIOLOGY Images were viewed by me  CT head FINDINGS: Brain: No evidence of acute infarction, hemorrhage, hydrocephalus, extra-axial collection or mass lesion/mass effect.  Vascular: No hyperdense vessel or unexpected calcification.  Skull: Negative for acute fracture or focal lesions.  Sinuses/Orbits: Moderate ethmoid and left maxillary sinus mucosal thickening with mild mucosal thickening of the right maxillary sinus and sphenoid. The frontal sinus is developmentally not pneumatized. Clear mastoids bilaterally.  Other: None.  IMPRESSION: Chronic appearing paranasal sinusitis. No acute intracranial abnormality. ____________________________________________  FINAL ASSESSMENT AND PLAN  Headache, Sinusitis  Plan: Patient's labs and imaging were dictated above. Patient had presented for headache which is likely sinus related. She will be started on Augmentin as well as decongestants and is stable for outpatient ENT follow-up if not improving.   Emily Filbert, MD   Note: This note was generated in part or whole with voice recognition software. Voice recognition is  usually quite accurate but there are transcription errors that can and very often do occur. I apologize for any typographical errors that were not detected and corrected.     Emily Filbert, MD 06/10/16 [phone]

## 2016-06-11 MED ORDER — CEFTRIAXONE SODIUM-DEXTROSE 1-3.74 GM-% IV SOLR
INTRAVENOUS | Status: AC
  Filled 2016-06-11: qty 50

## 2016-06-11 NOTE — ED Notes (Signed)
Pt back from ct

## 2016-06-12 ENCOUNTER — Ambulatory Visit: Payer: Medicaid Other | Admitting: Pharmacy Technician

## 2016-06-12 DIAGNOSIS — Z79899 Other long term (current) drug therapy: Secondary | ICD-10-CM

## 2016-06-12 NOTE — Progress Notes (Signed)
Completed [facility] application and contract.  Patient agreed to all terms of the [facility] contract.  Patient approved to receive medication assistance at [facility] through 2018, as long as eligibility continues to be met.  Provided patient with community resource material based on her particular needs.    [facility]

## 2016-06-20 ENCOUNTER — Encounter: Payer: Self-pay | Admitting: Emergency Medicine

## 2016-06-20 ENCOUNTER — Emergency Department: Payer: Self-pay

## 2016-06-20 ENCOUNTER — Emergency Department
Admission: EM | Admit: 2016-06-20 | Discharge: 2016-06-20 | Disposition: A | Discharge status: Home / Self Care | Payer: Self-pay | Attending: Emergency Medicine | Admitting: Emergency Medicine

## 2016-06-20 DIAGNOSIS — F17210 Nicotine dependence, cigarettes, uncomplicated: Secondary | ICD-10-CM | POA: Insufficient documentation

## 2016-06-20 DIAGNOSIS — A5901 Trichomonal vulvovaginitis: Secondary | ICD-10-CM | POA: Insufficient documentation

## 2016-06-20 DIAGNOSIS — E119 Type 2 diabetes mellitus without complications: Secondary | ICD-10-CM | POA: Insufficient documentation

## 2016-06-20 DIAGNOSIS — K760 Fatty (change of) liver, not elsewhere classified: Secondary | ICD-10-CM

## 2016-06-20 DIAGNOSIS — I10 Essential (primary) hypertension: Secondary | ICD-10-CM | POA: Insufficient documentation

## 2016-06-20 DIAGNOSIS — A599 Trichomoniasis, unspecified: Secondary | ICD-10-CM

## 2016-06-20 DIAGNOSIS — N390 Urinary tract infection, site not specified: Secondary | ICD-10-CM

## 2016-06-20 DIAGNOSIS — Z7984 Long term (current) use of oral hypoglycemic drugs: Secondary | ICD-10-CM | POA: Insufficient documentation

## 2016-06-20 DIAGNOSIS — Z79899 Other long term (current) drug therapy: Secondary | ICD-10-CM | POA: Insufficient documentation

## 2016-06-20 DIAGNOSIS — R1084 Generalized abdominal pain: Secondary | ICD-10-CM

## 2016-06-20 DIAGNOSIS — Z791 Long term (current) use of non-steroidal anti-inflammatories (NSAID): Secondary | ICD-10-CM | POA: Insufficient documentation

## 2016-06-20 DIAGNOSIS — R945 Abnormal results of liver function studies: Secondary | ICD-10-CM | POA: Insufficient documentation

## 2016-06-20 DIAGNOSIS — R7989 Other specified abnormal findings of blood chemistry: Secondary | ICD-10-CM

## 2016-06-20 LAB — WET PREP, GENITAL
CLUE CELLS WET PREP: NONE SEEN
Sperm: NONE SEEN
YEAST WET PREP: NONE SEEN

## 2016-06-20 LAB — URINALYSIS, COMPLETE (UACMP) WITH MICROSCOPIC
BILIRUBIN URINE: NEGATIVE
Glucose, UA: NEGATIVE mg/dL
KETONES UR: NEGATIVE mg/dL
Nitrite: NEGATIVE
PH: 6 (ref 5.0–8.0)
Protein, ur: 30 mg/dL — AB
SPECIFIC GRAVITY, URINE: 1.019 (ref 1.005–1.030)

## 2016-06-20 LAB — CHLAMYDIA/NGC RT PCR (ARMC ONLY)
CHLAMYDIA TR: NOT DETECTED
N gonorrhoeae: NOT DETECTED

## 2016-06-20 LAB — GLUCOSE, CAPILLARY: GLUCOSE-CAPILLARY: 98 mg/dL (ref 65–99)

## 2016-06-20 LAB — POCT PREGNANCY, URINE: Preg Test, Ur: NEGATIVE

## 2016-06-20 LAB — CBC
HCT: 42.2 % (ref 35.0–47.0)
Hemoglobin: 13.7 g/dL (ref 12.0–16.0)
MCH: 25.8 pg — AB (ref 26.0–34.0)
MCHC: 32.4 g/dL (ref 32.0–36.0)
MCV: 79.4 fL — AB (ref 80.0–100.0)
Platelets: 274 10*3/uL (ref 150–440)
RBC: 5.31 MIL/uL — ABNORMAL HIGH (ref 3.80–5.20)
RDW: 13.7 % (ref 11.5–14.5)
WBC: 5 10*3/uL (ref 3.6–11.0)

## 2016-06-20 LAB — COMPREHENSIVE METABOLIC PANEL
ALT: 57 U/L — AB (ref 14–54)
AST: 53 U/L — AB (ref 15–41)
Albumin: 4.4 g/dL (ref 3.5–5.0)
Alkaline Phosphatase: 71 U/L (ref 38–126)
Anion gap: 7 (ref 5–15)
BUN: 12 mg/dL (ref 6–20)
CHLORIDE: 104 mmol/L (ref 101–111)
CO2: 26 mmol/L (ref 22–32)
CREATININE: 0.62 mg/dL (ref 0.44–1.00)
Calcium: 9.4 mg/dL (ref 8.9–10.3)
GFR calc Af Amer: >60 mL/min (ref >60)
GFR calc non Af Amer: >60 mL/min (ref >60)
Glucose, Bld: 101 mg/dL — ABNORMAL HIGH (ref 65–99)
Potassium: 3.3 mmol/L — ABNORMAL LOW (ref 3.5–5.1)
SODIUM: 137 mmol/L (ref 135–145)
Total Bilirubin: 0.7 mg/dL (ref 0.3–1.2)
Total Protein: 9 g/dL — ABNORMAL HIGH (ref 6.5–8.1)

## 2016-06-20 LAB — LIPASE, BLOOD: LIPASE: 23 U/L (ref 11–51)

## 2016-06-20 MED ORDER — METRONIDAZOLE 500 MG PO TABS
2000.0000 mg | ORAL_TABLET | Freq: Once | ORAL | Status: AC
  Administered 2016-06-20: 2000 mg via ORAL
  Filled 2016-06-20: qty 4

## 2016-06-20 MED ORDER — SODIUM CHLORIDE 0.9 % IV BOLUS (SEPSIS)
1000.0000 mL | Freq: Once | INTRAVENOUS | Status: AC
  Administered 2016-06-20: 1000 mL via INTRAVENOUS

## 2016-06-20 MED ORDER — AZITHROMYCIN 500 MG PO TABS
1000.0000 mg | ORAL_TABLET | Freq: Once | ORAL | Status: AC
  Administered 2016-06-20: 1000 mg via ORAL
  Filled 2016-06-20: qty 2

## 2016-06-20 MED ORDER — CEPHALEXIN 500 MG PO CAPS: 500.0000 mg | ORAL_CAPSULE | Freq: Two times a day (BID) | ORAL | 0 refills | Status: DC

## 2016-06-20 MED ORDER — CEFTRIAXONE SODIUM IN DEXTROSE 20 MG/ML IV SOLN
1.0000 g | Freq: Once | INTRAVENOUS | Status: AC
  Administered 2016-06-20: 1 g via INTRAVENOUS
  Filled 2016-06-20: qty 50

## 2016-06-20 MED ORDER — KETOROLAC TROMETHAMINE 30 MG/ML IJ SOLN
30.0000 mg | Freq: Once | INTRAMUSCULAR | Status: AC
  Administered 2016-06-20: 30 mg via INTRAVENOUS
  Filled 2016-06-20: qty 1

## 2016-06-20 MED ORDER — IOPAMIDOL (ISOVUE-300) INJECTION 61%
125.0000 mL | Freq: Once | INTRAVENOUS | Status: AC | PRN
  Administered 2016-06-20: 125 mL via INTRAVENOUS

## 2016-06-20 MED ORDER — IOPAMIDOL (ISOVUE-300) INJECTION 61%
30.0000 mL | Freq: Once | INTRAVENOUS | Status: AC | PRN
  Administered 2016-06-20: 30 mL via ORAL

## 2016-06-20 MED ORDER — ONDANSETRON HCL 4 MG/2ML IJ SOLN
4.0000 mg | Freq: Once | INTRAMUSCULAR | Status: AC
  Administered 2016-06-20: 4 mg via INTRAVENOUS
  Filled 2016-06-20: qty 2

## 2016-06-20 NOTE — ED Notes (Signed)
CT notified that pt has finished drinking contrast 

## 2016-06-20 NOTE — ED Triage Notes (Signed)
Nausea, vomiting and abd pain x 3 days. Denies fevers.

## 2016-06-20 NOTE — Discharge Instructions (Signed)
You were evaluated for abdominal pain, and are being treated for urinary tract infection with antibiotic Keflex.  You are also treated in the emergency department for Trichomonas vaginalis which is a sexual transmitted disease. You are also given medication that would cover for gonorrhea and Chlamydia although these tests were not back while you're in the emergency department.  Return to the emergency department immediately for any worsening abdominal pain, fever black or bloody stools, vomiting blood, or any other symptoms concerning to you.

## 2016-06-20 NOTE — ED Notes (Signed)
Patient transported to CT 

## 2016-06-20 NOTE — ED Provider Notes (Signed)
[facility] Emergency Department Provider Note ____________________________________________   I have reviewed the triage vital signs and the triage nursing note.  HISTORY  Chief Complaint Abdominal Pain   Historian Patient  HPI Pamela Kemp is a 26 y.o. female with obesity, type 2 dm, gerd, presents with mid abd pain for 2-3 days, grad onset, waxing and waning.  Seems like worse after eating.  She started metformin a few weeks ago and has had some nausea and poor digestion since then.  No diarrhea.  No vomiting.  No fevers.  Denies dysuria or hematuria. Denies vaginal discharge. Pain is reported currently 9 out of 10.    Past Medical History:  Diagnosis Date  . Gastroesophageal reflux disease without esophagitis 03/13/2016  . Hypertension   . Morbid obesity ([place]) 04/03/2016  . Tobacco use 04/08/2016  . Type 2 diabetes mellitus without complication, with long-term current use of insulin ([place]) 02/16/2016    Patient Active Problem List   Diagnosis Date Noted  . Tobacco use 04/08/2016  . Morbid obesity ([place]) 04/03/2016  . Bilateral leg and foot pain 04/03/2016  . Pes planus 04/03/2016  . Benign hypertension 04/03/2016  . Gastroesophageal reflux disease without esophagitis 03/13/2016  . Type 2 diabetes mellitus without complication, with long-term current use of insulin ([place]) 02/16/2016    History reviewed. No pertinent surgical history.  Prior to Admission medications   Medication Sig Start Date End Date Taking? Authorizing Provider  amLODipine (NORVASC) 10 MG tablet Take 1 tablet (10 mg total) by mouth daily. 04/03/16  Yes Arnetha Courser, MD  glipiZIDE (GLUCOTROL) 5 MG tablet Take 1 tablet (5 mg total) by mouth daily before breakfast. 05/23/16  Yes Shannon A McGowan, PA-C  oxymetazoline (AFRIN) 0.05 % nasal spray Place 2 sprays into both nostrils 2 (two) times daily. 06/10/16 06/10/17 Yes Earleen Newport, MD  blood glucose meter kit and supplies  KIT Dispense based on patient and insurance preference. Use one time daily as directed.; E11.9 04/03/16   Arnetha Courser, MD  cephALEXin (KEFLEX) 500 MG capsule Take 1 capsule (500 mg total) by mouth 2 (two) times daily. 06/20/16   Lisa Roca, MD  hydrochlorothiazide (HYDRODIURIL) 25 MG tablet Take 1 tablet (25 mg total) by mouth daily. Patient not taking: Reported on 06/20/2016 05/23/16   Larene Beach A McGowan, PA-C  ibuprofen (ADVIL,MOTRIN) 800 MG tablet Take 1 tablet (800 mg total) by mouth every 8 (eight) hours as needed. 06/10/16   Earleen Newport, MD  levofloxacin (LEVAQUIN) 500 MG tablet Take 1 tablet (500 mg total) by mouth daily. Patient not taking: Reported on 06/20/2016 06/10/16 06/20/16  Earleen Newport, MD    Allergies  Allergen Reactions  . Amoxicillin Rash    rash    Family History  Problem Relation Age of Onset  . Hypertension Mother   . Diabetes Mother   . Multiple sclerosis Father     Social History Social History  Substance Use Topics  . Smoking status: Current Every Day Smoker    Years: 8.00    Types: Cigars  . Smokeless tobacco: Never Used     Comment: 2 cigars per day  . Alcohol use 0.6 oz/week    1 Standard drinks or equivalent per week     Comment: wine coolers    Review of Systems  Constitutional: Negative for fever. Eyes: Negative for visual changes. ENT: Negative for sore throat. Cardiovascular: Negative for chest pain. Respiratory: Negative for shortness of breath. Gastrointestinal: Negative  for vomiting and diarrhea. Genitourinary: Negative for dysuria. Musculoskeletal: Negative for back pain. Skin: Negative for rash. Neurological: Negative for headache. 10 point Review of Systems otherwise negative ____________________________________________   PHYSICAL EXAM:  VITAL SIGNS: ED Triage Vitals  Enc Vitals Group     BP 06/20/16 0848 (!) 148/97     Pulse Rate 06/20/16 0848 72     Resp 06/20/16 0848 20     Temp 06/20/16 0848 97.8 F (36.6 C)      Temp Source 06/20/16 0848 Oral     SpO2 06/20/16 0848 99 %     Weight 06/20/16 0849 (!) 341 lb (154.7 kg)     Height 06/20/16 0849 _0  (1.727 m)     Head Circumference --      Peak Flow --      Pain Score 06/20/16 0847 9     Pain Loc --      Pain Edu? --      Excl. in [place]? --      Constitutional: Alert and oriented. Well appearing and in no distress. HEENT   Head: Normocephalic and atraumatic.      Eyes: Conjunctivae are normal. PERRL. Disconjugate gaze.      Ears:         Nose: No congestion/rhinnorhea.   Mouth/Throat: Mucous membranes are moist.   Neck: No stridor. Cardiovascular/Chest: Normal rate, regular rhythm.  No murmurs, rubs, or gallops. Respiratory: Normal respiratory effort without tachypnea nor retractions. Breath sounds are clear and equal bilaterally. No wheezes/rales/rhonchi. Gastrointestinal: Soft. No distention, no guarding, no rebound. Morbidly obese. Mid abdomen/periumbilical tenderness to palpation which is moderate. No focal right upper quadrant or epigastric pain. No focal right lower quadrant or pelvic or suprapubic tenderness on palpation.  Genitourinary/rectal:  Foul vaginal discharge without cervical motion tenderness or adnexal tenderness. Musculoskeletal: Nontender with normal range of motion in all extremities. No joint effusions.  No lower extremity tenderness.  No edema. Neurologic:  Normal speech and language. No gross or focal neurologic deficits are appreciated. Skin:  Skin is warm, dry and intact. No rash noted. Psychiatric: Mood and affect are normal. Speech and behavior are normal. Patient exhibits appropriate insight and judgment.   ____________________________________________  LABS (pertinent positives/negatives)  Labs Reviewed  WET PREP, GENITAL - Abnormal; Notable for the following:       Result Value   Trich, Wet Prep PRESENT (*)    WBC, Wet Prep HPF POC MANY (*)    All other components within normal limits   COMPREHENSIVE METABOLIC PANEL - Abnormal; Notable for the following:    Potassium 3.3 (*)    Glucose, Bld 101 (*)    Total Protein 9.0 (*)    AST 53 (*)    ALT 57 (*)    All other components within normal limits  CBC - Abnormal; Notable for the following:    RBC 5.31 (*)    MCV 79.4 (*)    MCH 25.8 (*)    All other components within normal limits  URINALYSIS, COMPLETE (UACMP) WITH MICROSCOPIC - Abnormal; Notable for the following:    Color, Urine YELLOW (*)    APPearance CLOUDY (*)    Hgb urine dipstick SMALL (*)    Protein, ur 30 (*)    Leukocytes, UA LARGE (*)    Bacteria, UA MANY (*)    Squamous Epithelial / LPF 6-30 (*)    All other components within normal limits  CHLAMYDIA/NGC RT PCR ([facility] ONLY)  URINE CULTURE  LIPASE,  BLOOD  GLUCOSE, CAPILLARY  POC URINE PREG, ED  POCT PREGNANCY, URINE    ____________________________________________    EKG I, Lisa Roca, MD, the attending physician have personally viewed and interpreted all ECGs.  None ____________________________________________  RADIOLOGY All Xrays were viewed by me. Imaging interpreted by Radiologist.  [place] ruq:  IMPRESSION: Limited study due to the patient's body habitus. No evidence of gallstones or acute cholecystitis. If there are clinical concerns of chronic cholecystitis, nuclear medicine hepatobiliary scan with gallbladder ejection fraction determination may be useful.  Increased hepatic echotexture most compatible with fatty infiltrative change.   CT abd pel:  FINDINGS: Lower chest: No acute abnormality.  Hepatobiliary: Diffuse fatty infiltration of the liver. The gallbladder is within normal limits.  Pancreas: Unremarkable. No pancreatic ductal dilatation or surrounding inflammatory changes.  Spleen: Normal in size without focal abnormality.  Adrenals/Urinary Tract: Adrenal glands are unremarkable. Kidneys are normal, without renal calculi, focal lesion, or  hydronephrosis. Bladder is unremarkable.  Stomach/Bowel: Stomach is within normal limits. Appendix appears normal. No evidence of bowel wall thickening, distention, or inflammatory changes.  Vascular/Lymphatic: No significant vascular findings are present. No enlarged abdominal or pelvic lymph nodes.  Reproductive: Uterus and bilateral adnexa are unremarkable.  Other: No abdominal wall hernia or abnormality. No abdominopelvic ascites.  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 Mild degenerative changes of the lumbar spine are seen.  IMPRESSION: No acute abnormality noted. Chronic changes as described above. __________________________________________  PROCEDURES  Procedure(s) performed: None  Critical Care performed: None  ____________________________________________   ED COURSE / ASSESSMENT AND PLAN  Pertinent labs & imaging results that were available during my care of the patient were reviewed by me and considered in my medical decision making (see chart for details).   Ms. Hackel is reporting several weeks of intermittent and several days now of constant but waxing and waning mid abdominal pain which is now reported as severe. No other vomiting or diarrhea or fevers or extension into the upper abdomen or lower abdomen. She does have a evidence of urinary tract infection on her UA. I will send a culture and go ahead and treat with first dose Rocephin. She reports rash to amoxicillin but we chose to go ahead with Rocephin here rather than Cipro because of other concerns including tendon rupture.   Pelvic exam revealed a foul discharge and positive for Trichomonas on wet prep. Gonorrhea and Chlamydia testing not back, but I am going to go ahead and cover for possible chlamydia with azithromycin given the positive STD Trichomonas. She received Rocephin already which will cover for possible gonorrhea. She is given 2 g of  Flagyl in the ED for Trichomonas. She was given 1 g of azithromycin in the ED by mouth.   Ultrasound without emergency findings. Clinical suspicion for hepatobiliary dysfunction is relatively low given the location of her discomfort. Clinically not concerned about cholecystitis at this point time. I spoke with her about obtaining a CT for evaluation of mid abdominal pain and she wanted to proceed with this after discussion of risk and benefit with regard to CT radiation.  CT reassuring for no emergency findings. I am going to go ahead and discharge her home today.    CONSULTATIONS:   None   Patient / Family / Caregiver informed of clinical course, medical decision-making process, and agree with plan.   I discussed return precautions, follow-up instructions, and discharge instructions with patient and/or family.  Discharge instructions:  You  were evaluated for abdominal pain, and are being treated for urinary tract infection with antibiotic Keflex.  You are also treated in the emergency department for Trichomonas vaginalis which is a sexual transmitted disease. You are also given medication that would cover for gonorrhea and Chlamydia although these tests were not back while you're in the emergency department.  Return to the emergency department immediately for any worsening abdominal pain, fever black or bloody stools, vomiting blood, or any other symptoms concerning to you.  ___________________________________________   FINAL CLINICAL IMPRESSION(S) / ED DIAGNOSES   Final diagnoses:  Abnormal LFTs  Generalized abdominal pain  Trichomonas vaginalis infection  Urinary tract infection without hematuria, site unspecified  Fatty liver              Note: This dictation was prepared with Dragon dictation. Any transcriptional errors that result from this process are unintentional    Lisa Roca, MD 06/20/16 1418

## 2016-06-21 LAB — URINE CULTURE

## 2016-06-26 ENCOUNTER — Ambulatory Visit: Payer: Self-pay | Admitting: Adult Health Nurse Practitioner

## 2016-06-26 VITALS — BP 142/96 | HR 95 | Temp 98.1°F | Wt 350.0 lb

## 2016-06-26 DIAGNOSIS — I10 Essential (primary) hypertension: Secondary | ICD-10-CM

## 2016-06-26 DIAGNOSIS — E119 Type 2 diabetes mellitus without complications: Secondary | ICD-10-CM

## 2016-06-26 LAB — GLUCOSE, POCT (MANUAL RESULT ENTRY): POC GLUCOSE: 86 mg/dL (ref 70–99)

## 2016-06-26 MED ORDER — METOPROLOL SUCCINATE ER 25 MG PO TB24: 25.0000 mg | ORAL_TABLET | Freq: Every day | ORAL | 1 refills | Status: DC

## 2016-06-26 MED ORDER — AMLODIPINE BESYLATE 10 MG PO TABS: 10.0000 mg | ORAL_TABLET | Freq: Every day | ORAL | 5 refills | Status: DC

## 2016-06-26 NOTE — Progress Notes (Signed)
  Patient: Pamela Kemp Female    DOB: Jul 05, 1990   25 y.o.   MRN: [id number] Visit Date: 06/26/2016  Today's Provider: Staci Acosta, NP   Chief Complaint  Patient presents with  . Follow-up    Glipizide making pt sick   Subjective:    HPI  Seen in the ED on 5.2.18 for abdominal pain. [place] and CT both unremarkable.  Was started on metformin in Jan for DM- stopped due to nausea and diarrhea.  Started on glipizide and reports that she is still having upset stomach and diarrhea, vomiting.  Pt states that she cannot eat- everything "comes up" after a meal.  Last A1C was 6.7.     BP elevated today:  Last visit BP was 154/100 in March.  Pt is on Amlodipine.  HCTZ was d/c'd due to ineffectiveness per pt report.   Pt states she is having bad heartburn and indigestion.   Allergies  Allergen Reactions  . Amoxicillin Rash    rash   Previous Medications   AMLODIPINE (NORVASC) 10 MG TABLET    Take 1 tablet (10 mg total) by mouth daily.   BLOOD GLUCOSE METER KIT AND SUPPLIES KIT    Dispense based on patient and insurance preference. Use one time daily as directed.; E11.9   CEPHALEXIN (KEFLEX) 500 MG CAPSULE    Take 1 capsule (500 mg total) by mouth 2 (two) times daily.   GLIPIZIDE (GLUCOTROL) 5 MG TABLET    Take 1 tablet (5 mg total) by mouth daily before breakfast.   HYDROCHLOROTHIAZIDE (HYDRODIURIL) 25 MG TABLET    Take 1 tablet (25 mg total) by mouth daily.   IBUPROFEN (ADVIL,MOTRIN) 800 MG TABLET    Take 1 tablet (800 mg total) by mouth every 8 (eight) hours as needed.   OXYMETAZOLINE (AFRIN) 0.05 % NASAL SPRAY    Place 2 sprays into both nostrils 2 (two) times daily.    Review of Systems  All other systems reviewed and are negative.   Social History  Substance Use Topics  . Smoking status: Current Every Day Smoker    Years: 8.00    Types: Cigars  . Smokeless tobacco: Never Used     Comment: 2 cigars per day  . Alcohol use 0.6 oz/week    1 Standard drinks or  equivalent per week     Comment: wine coolers   Objective:   BP (!) 142/96   Pulse 95   Temp 98.1 F (36.7 C)   Wt (!) 350 lb (158.8 kg)   LMP 06/06/2016   BMI 53.22 kg/m   Physical Exam  Constitutional: She appears well-developed and well-nourished.  HENT:  Head: Normocephalic and atraumatic.  Cardiovascular: Normal rate, regular rhythm and normal heart sounds.   Pulmonary/Chest: Effort normal and breath sounds normal.  Abdominal: Soft. Bowel sounds are normal. She exhibits no distension. There is no tenderness.  Vitals reviewed.       Assessment & Plan:          A1C today.  Stop glipizide.  Monitor for GI symptom resolution.  Supportive care.  Strict diet and exercise for DM control.   GERD:  Given trial nexium.  Discussed triggers to avoid.   Will add toprol XL 80mto medication regimen- child-bearing age.   FU in 6 weeks for BP check.   TStaci Acosta NP   [facility]

## 2016-07-02 ENCOUNTER — Emergency Department: Payer: Self-pay

## 2016-07-02 ENCOUNTER — Emergency Department
Admission: EM | Admit: 2016-07-02 | Discharge: 2016-07-02 | Disposition: A | Discharge status: Home / Self Care | Payer: Self-pay | Attending: Emergency Medicine | Admitting: Emergency Medicine

## 2016-07-02 ENCOUNTER — Ambulatory Visit: Payer: Medicaid Other

## 2016-07-02 ENCOUNTER — Encounter: Payer: Self-pay | Admitting: Emergency Medicine

## 2016-07-02 DIAGNOSIS — I10 Essential (primary) hypertension: Secondary | ICD-10-CM | POA: Insufficient documentation

## 2016-07-02 DIAGNOSIS — E119 Type 2 diabetes mellitus without complications: Secondary | ICD-10-CM | POA: Insufficient documentation

## 2016-07-02 DIAGNOSIS — Y939 Activity, unspecified: Secondary | ICD-10-CM | POA: Insufficient documentation

## 2016-07-02 DIAGNOSIS — S8012XA Contusion of left lower leg, initial encounter: Secondary | ICD-10-CM

## 2016-07-02 DIAGNOSIS — Y999 Unspecified external cause status: Secondary | ICD-10-CM | POA: Insufficient documentation

## 2016-07-02 DIAGNOSIS — F17290 Nicotine dependence, other tobacco product, uncomplicated: Secondary | ICD-10-CM | POA: Insufficient documentation

## 2016-07-02 DIAGNOSIS — W1830XA Fall on same level, unspecified, initial encounter: Secondary | ICD-10-CM | POA: Insufficient documentation

## 2016-07-02 DIAGNOSIS — S8002XA Contusion of left knee, initial encounter: Secondary | ICD-10-CM | POA: Insufficient documentation

## 2016-07-02 DIAGNOSIS — Y929 Unspecified place or not applicable: Secondary | ICD-10-CM | POA: Insufficient documentation

## 2016-07-02 MED ORDER — IBUPROFEN 600 MG PO TABS: 600.0000 mg | ORAL_TABLET | Freq: Four times a day (QID) | ORAL | 0 refills | Status: DC | PRN

## 2016-07-02 MED ORDER — TRAMADOL HCL 50 MG PO TABS: 50.0000 mg | ORAL_TABLET | Freq: Four times a day (QID) | ORAL | 0 refills | Status: DC | PRN

## 2016-07-02 MED ORDER — OXYCODONE-ACETAMINOPHEN 5-325 MG PO TABS
1.0000 | ORAL_TABLET | Freq: Once | ORAL | Status: AC
  Administered 2016-07-02: 1 via ORAL
  Filled 2016-07-02: qty 1

## 2016-07-02 MED ORDER — IBUPROFEN 600 MG PO TABS
600.0000 mg | ORAL_TABLET | Freq: Once | ORAL | Status: AC
  Administered 2016-07-02: 600 mg via ORAL
  Filled 2016-07-02: qty 1

## 2016-07-02 NOTE — ED Provider Notes (Signed)
[facility] Emergency Department Provider Note   ____________________________________________   First MD Initiated Contact with Patient 07/02/16 [phone]     (approximate)  I have reviewed the triage vital signs and the nursing notes.   HISTORY  Chief Complaint Fall    HPI Pamela Kemp is a 26 y.o. female patient arrived via EMS complaining of left knee pain secondary to a fall. Patient states she landed on her left knee. Patient reports a hyperflexion injury also to the left knee from the fall. Patient stated pain increases with ambulation. No palliative measures for complaint. Patient rates the pain as a 10 over 10. Patient described a pain as "achy".  Past Medical History:  Diagnosis Date  . Gastroesophageal reflux disease without esophagitis 03/13/2016  . Hypertension   . Morbid obesity ([place]) 04/03/2016  . Tobacco use 04/08/2016  . Type 2 diabetes mellitus without complication, with long-term current use of insulin ([place]) 02/16/2016    Patient Active Problem List   Diagnosis Date Noted  . Tobacco use 04/08/2016  . Morbid obesity ([place]) 04/03/2016  . Bilateral leg and foot pain 04/03/2016  . Pes planus 04/03/2016  . Benign hypertension 04/03/2016  . Gastroesophageal reflux disease without esophagitis 03/13/2016  . Diabetes mellitus without complication ([place]) 89/21/1941    History reviewed. No pertinent surgical history.  Prior to Admission medications   Medication Sig Start Date End Date Taking? Authorizing Provider  amLODipine (NORVASC) 10 MG tablet Take 1 tablet (10 mg total) by mouth daily. 06/26/16   Doles-Johnson, Teah, NP  blood glucose meter kit and supplies KIT Dispense based on patient and insurance preference. Use one time daily as directed.; E11.9 04/03/16   Lada, Satira Anis, MD  cephALEXin (KEFLEX) 500 MG capsule Take 1 capsule (500 mg total) by mouth 2 (two) times daily. 06/20/16   Lisa Roca, MD  ibuprofen (ADVIL,MOTRIN) 600 MG  tablet Take 1 tablet (600 mg total) by mouth every 6 (six) hours as needed. 07/02/16   Sable Feil, PA-C  ibuprofen (ADVIL,MOTRIN) 800 MG tablet Take 1 tablet (800 mg total) by mouth every 8 (eight) hours as needed. 06/10/16   Earleen Newport, MD  metoprolol succinate (TOPROL XL) 25 MG 24 hr tablet Take 1 tablet (25 mg total) by mouth daily. 06/26/16   Doles-Johnson, Teah, NP  oxymetazoline (AFRIN) 0.05 % nasal spray Place 2 sprays into both nostrils 2 (two) times daily. 06/10/16 06/10/17  Earleen Newport, MD  traMADol (ULTRAM) 50 MG tablet Take 1 tablet (50 mg total) by mouth every 6 (six) hours as needed for moderate pain. 07/02/16   Sable Feil, PA-C    Allergies Amoxicillin  Family History  Problem Relation Age of Onset  . Hypertension Mother   . Diabetes Mother   . Multiple sclerosis Father     Social History Social History  Substance Use Topics  . Smoking status: Current Every Day Smoker    Years: 8.00    Types: Cigars  . Smokeless tobacco: Never Used     Comment: 2 cigars per day  . Alcohol use 0.6 oz/week    1 Standard drinks or equivalent per week     Comment: wine coolers    Review of Systems  Constitutional: No fever/chills Eyes: No visual changes. ENT: No sore throat. Cardiovascular: Denies chest pain. Respiratory: Denies shortness of breath. Gastrointestinal: No abdominal pain.  No nausea, no vomiting.  No diarrhea.  No constipation. Genitourinary: Negative for dysuria. Musculoskeletal: Left knee  pain  Skin: Negative for rash. Neurological: Negative for headaches, focal weakness or numbness. Endocrine:Diabetes and hypertension Allergic/Immunilogical: Amoxicillin ____________________________________________   PHYSICAL EXAM:  VITAL SIGNS: ED Triage Vitals  Enc Vitals Group     BP 07/02/16 0829 115/62     Pulse Rate 07/02/16 0829 71     Resp 07/02/16 0829 15     Temp 07/02/16 0829 98.3 F (36.8 C)     Temp Source 07/02/16 0829 Oral      SpO2 07/02/16 0829 98 %     Weight 07/02/16 0829 (!) 350 lb (158.8 kg)     Height 07/02/16 0829 _0  (1.727 m)     Head Circumference --      Peak Flow --      Pain Score 07/02/16 0830 10     Pain Loc --      Pain Edu? --      Excl. in [place]? --     Constitutional: Alert and oriented. Well appearing and in no acute distress. Morbid obesity Eyes: Conjunctivae are normal. PERRL. EOMI. Head: Atraumatic. Nose: No congestion/rhinnorhea. Mouth/Throat: Mucous membranes are moist.  Oropharynx non-erythematous. Neck: No stridor.  No cervical spine tenderness to palpation. Hematological/Lymphatic/Immunilogical: No cervical lymphadenopathy. Cardiovascular: Normal rate, regular rhythm. Grossly normal heart sounds.  Good peripheral circulation. Respiratory: Normal respiratory effort.  No retractions. Lungs CTAB. Gastrointestinal: Soft and nontender. No distention. No abdominal bruits. No CVA tenderness. Musculoskeletal: Exam limited by patient's body habitus. No obvious left knee deformity. Moderate guarding palpation anterior patella. Patient has full equal range of motion. Patient complaining of pain does increase  with flexion of the knee. Patient is atypical K for a period.  Neurologic:  Normal speech and language. No gross focal neurologic deficits are appreciated. No gait instability. Skin:  Skin is warm, dry and intact. No rash noted. Psychiatric: Mood and affect are normal. Speech and behavior are normal.  ____________________________________________   LABS (all labs ordered are listed, but only abnormal results are displayed)  Labs Reviewed - No data to display ____________________________________________  EKG   ____________________________________________  RADIOLOGY  No acute findings x-ray of the left knee. ____________________________________________   PROCEDURES  Procedure(s) performed: None  Procedures  Critical Care performed:  No  ____________________________________________   INITIAL IMPRESSION / ASSESSMENT AND PLAN / ED COURSE  Pertinent labs & imaging results that were available during my care of the patient were reviewed by me and considered in my medical decision making (see chart for details). Left knee contusion. Discussed x-ray finding with patient. Patient given discharge care instructions. Patient given a work note for 2 days. Patient advised to follow-up with PCP if condition persists.      ____________________________________________   FINAL CLINICAL IMPRESSION(S) / ED DIAGNOSES  Final diagnoses:  Contusion of left knee and lower leg, initial encounter      NEW MEDICATIONS STARTED DURING THIS VISIT:  New Prescriptions   IBUPROFEN (ADVIL,MOTRIN) 600 MG TABLET    Take 1 tablet (600 mg total) by mouth every 6 (six) hours as needed.   TRAMADOL (ULTRAM) 50 MG TABLET    Take 1 tablet (50 mg total) by mouth every 6 (six) hours as needed for moderate pain.     Note:  This document was prepared using Dragon voice recognition software and may include unintentional dictation errors.    Sable Feil, PA-C 07/02/16 Kohler, [place], MD 07/02/16 [phone]

## 2016-07-02 NOTE — ED Triage Notes (Signed)
Brought in via ems from home  States she slipped in water last pm  States her left knee bent backwards  having pain to left knee   Increased pain with ambulation

## 2016-08-09 ENCOUNTER — Ambulatory Visit: Payer: Self-pay

## 2016-08-16 ENCOUNTER — Ambulatory Visit: Payer: Self-pay

## 2016-09-17 ENCOUNTER — Other Ambulatory Visit: Payer: Self-pay

## 2016-09-17 DIAGNOSIS — E1159 Type 2 diabetes mellitus with other circulatory complications: Secondary | ICD-10-CM

## 2016-09-17 DIAGNOSIS — I10 Essential (primary) hypertension: Principal | ICD-10-CM

## 2016-09-17 MED ORDER — METOPROLOL SUCCINATE ER 25 MG PO TB24: 25.0000 mg | ORAL_TABLET | Freq: Every day | ORAL | 1 refills | Status: DC

## 2016-11-22 ENCOUNTER — Telehealth: Payer: Self-pay

## 2016-11-22 DIAGNOSIS — I152 Hypertension secondary to endocrine disorders: Secondary | ICD-10-CM

## 2016-11-22 DIAGNOSIS — E1159 Type 2 diabetes mellitus with other circulatory complications: Secondary | ICD-10-CM

## 2016-11-22 DIAGNOSIS — I10 Essential (primary) hypertension: Principal | ICD-10-CM

## 2016-11-22 MED ORDER — AMLODIPINE BESYLATE 10 MG PO TABS: 10.0000 mg | ORAL_TABLET | Freq: Every day | ORAL | 5 refills | Status: DC

## 2016-11-22 MED ORDER — METOPROLOL SUCCINATE ER 25 MG PO TB24: 25.0000 mg | ORAL_TABLET | Freq: Every day | ORAL | 5 refills | Status: AC

## 2016-11-22 NOTE — Telephone Encounter (Signed)
Refill sent to [facility] 

## 2016-11-29 ENCOUNTER — Ambulatory Visit: Payer: Self-pay

## 2016-12-04 ENCOUNTER — Telehealth: Payer: Self-pay | Admitting: Adult Health Nurse Practitioner

## 2016-12-04 NOTE — Telephone Encounter (Signed)
Wants dental referral

## 2017-01-25 ENCOUNTER — Emergency Department: Payer: Self-pay

## 2017-01-25 ENCOUNTER — Encounter: Payer: Self-pay | Admitting: Emergency Medicine

## 2017-01-25 ENCOUNTER — Emergency Department
Admission: EM | Admit: 2017-01-25 | Discharge: 2017-01-25 | Disposition: A | Discharge status: Home / Self Care | Payer: Self-pay | Attending: Emergency Medicine | Admitting: Emergency Medicine

## 2017-01-25 DIAGNOSIS — F17290 Nicotine dependence, other tobacco product, uncomplicated: Secondary | ICD-10-CM | POA: Insufficient documentation

## 2017-01-25 DIAGNOSIS — R079 Chest pain, unspecified: Secondary | ICD-10-CM | POA: Insufficient documentation

## 2017-01-25 DIAGNOSIS — R200 Anesthesia of skin: Secondary | ICD-10-CM | POA: Insufficient documentation

## 2017-01-25 DIAGNOSIS — I10 Essential (primary) hypertension: Secondary | ICD-10-CM | POA: Insufficient documentation

## 2017-01-25 DIAGNOSIS — E119 Type 2 diabetes mellitus without complications: Secondary | ICD-10-CM | POA: Insufficient documentation

## 2017-01-25 LAB — CBC
HEMATOCRIT: 41 % (ref 35.0–47.0)
Hemoglobin: 13.3 g/dL (ref 12.0–16.0)
MCH: 26.3 pg (ref 26.0–34.0)
MCHC: 32.5 g/dL (ref 32.0–36.0)
MCV: 81.1 fL (ref 80.0–100.0)
PLATELETS: 187 10*3/uL (ref 150–440)
RBC: 5.06 MIL/uL (ref 3.80–5.20)
RDW: 13.8 % (ref 11.5–14.5)
WBC: 3.3 10*3/uL — AB (ref 3.6–11.0)

## 2017-01-25 LAB — BASIC METABOLIC PANEL
ANION GAP: 9 (ref 5–15)
BUN: 11 mg/dL (ref 6–20)
CHLORIDE: 109 mmol/L (ref 101–111)
CO2: 20 mmol/L — AB (ref 22–32)
Calcium: 9.4 mg/dL (ref 8.9–10.3)
Creatinine, Ser: 0.66 mg/dL (ref 0.44–1.00)
GFR calc non Af Amer: >60 mL/min (ref >60)
Glucose, Bld: 146 mg/dL — ABNORMAL HIGH (ref 65–99)
POTASSIUM: 3.4 mmol/L — AB (ref 3.5–5.1)
SODIUM: 138 mmol/L (ref 135–145)

## 2017-01-25 LAB — TROPONIN I: Troponin I: <0.03 ng/mL (ref <0.03)

## 2017-01-25 LAB — POC URINE PREG, ED: Preg Test, Ur: NEGATIVE

## 2017-01-25 MED ORDER — NITROGLYCERIN 0.4 MG SL SUBL
0.4000 mg | SUBLINGUAL_TABLET | SUBLINGUAL | Status: DC | PRN
  Administered 2017-01-25 (×3): 0.4 mg via SUBLINGUAL
  Filled 2017-01-25: qty 1

## 2017-01-25 MED ORDER — ASPIRIN 81 MG PO CHEW
324.0000 mg | CHEWABLE_TABLET | Freq: Once | ORAL | Status: AC
  Administered 2017-01-25: 324 mg via ORAL
  Filled 2017-01-25: qty 4

## 2017-01-25 MED ORDER — GI COCKTAIL ~~LOC~~
ORAL | Status: AC
  Filled 2017-01-25: qty 30

## 2017-01-25 MED ORDER — GI COCKTAIL ~~LOC~~
30.0000 mL | Freq: Once | ORAL | Status: AC
  Administered 2017-01-25: 30 mL via ORAL

## 2017-01-25 MED ORDER — POTASSIUM CHLORIDE CRYS ER 20 MEQ PO TBCR
40.0000 meq | EXTENDED_RELEASE_TABLET | Freq: Once | ORAL | Status: AC
  Administered 2017-01-25: 40 meq via ORAL
  Filled 2017-01-25: qty 2

## 2017-01-25 NOTE — Discharge Instructions (Signed)
Please make an appointment to follow-up with a cardiologist; they may choose to perform a stress test for further evaluation of your chest pain.  Return to the emergency department if you develop severe pain, shortness of breath, lightheadedness or fainting, or any other symptoms concerning to you.

## 2017-01-25 NOTE — ED Provider Notes (Addendum)
[facility] Emergency Department Provider Note  ____________________________________________  Time seen: Approximately 4:26 PM  I have reviewed the triage vital signs and the nursing notes.   HISTORY  Chief Complaint Chest Pain    HPI Pamela Kemp is a 26 y.o. female history of hypertension, diabetes, morbid obesity and ongoing tobacco abuse presenting with chest pain.  The patient reports that she was getting ready for work around 2 PM when she developed a left-sided nonradiating chest tightness associated with left arm numbness.  She did not have any diaphoresis, nausea or vomiting, palpitations, lightheadedness or syncope.  She has not had any recent cough or cold symptoms.  She denies any headache, visual or speech changes, or difficulty walking.  The left arm numbness lasted approximately 30 minutes and self resolved, but she continues to have chest tightness at this time.  The patient has never undergone a cardiac risk stratification study in the past.  SH: + tobacco, denies cocaine  FH: no blood clots; GM w/ MI/death age 26  Past Medical History:  Diagnosis Date  . Gastroesophageal reflux disease without esophagitis 03/13/2016  . Hypertension   . Morbid obesity (HCC) 04/03/2016  . Tobacco use 04/08/2016  . Type 2 diabetes mellitus without complication, with long-term current use of insulin (HCC) 02/16/2016    Patient Active Problem List   Diagnosis Date Noted  . Tobacco use 04/08/2016  . Morbid obesity (HCC) 04/03/2016  . Bilateral leg and foot pain 04/03/2016  . Pes planus 04/03/2016  . Benign hypertension 04/03/2016  . Gastroesophageal reflux disease without esophagitis 03/13/2016  . Diabetes mellitus without complication (HCC) 02/16/2016    History reviewed. No pertinent surgical history.  Current Outpatient Rx  . Order #: [id number] Class: Normal  . Order #: [id number] Class: Print  . Order #: [id number] Class: Print  . Order #:  [id number] Class: Print  . Order #: [id number] Class: Print  . Order #: [id number] Class: Normal  . Order #: [id number] Class: Print  . Order #: [id number] Class: Print    Allergies Amoxicillin  Family History  Problem Relation Age of Onset  . Hypertension Mother   . Diabetes Mother   . Multiple sclerosis Father     Social History Social History   Tobacco Use  . Smoking status: Current Every Day Smoker    Years: 8.00    Types: Cigars  . Smokeless tobacco: Never Used  . Tobacco comment: 2 cigars per day  Substance Use Topics  . Alcohol use: Yes    Alcohol/week: 0.6 oz    Types: 1 Standard drinks or equivalent per week    Comment: wine coolers  . Drug use: Yes    Types: Marijuana    Comment: occassionally    Review of Systems Constitutional: No fever/chills.  Lightheadedness or syncope.  No diaphoresis. Eyes: No visual changes.  No blurred or double vision. ENT: No sore throat. No congestion or rhinorrhea. Cardiovascular: Positive chest pain. Denies palpitations. Respiratory: Denies shortness of breath.  No cough. Gastrointestinal: No abdominal pain.  No nausea, no vomiting.  No diarrhea.  No constipation. Genitourinary: Negative for dysuria. Musculoskeletal: Negative for back pain. Skin: Negative for rash. Neurological: Negative for headaches. No focal , tingling or weakness.  Positive left upper extremity numbness.  No difficulty walking.  No changes in vision, speech or mental status.    ____________________________________________   PHYSICAL EXAM:  VITAL SIGNS: ED Triage Vitals [01/25/17 1530]  Enc Vitals Group     BP 136/77     Pulse Rate  65     Resp 16     Temp (!) 97.5 F (36.4 C)     Temp Source Oral     SpO2 100 %     Weight (!) 343 lb (155.6 kg)     Height 5\' 6"  (1.676 m)     Head Circumference      Peak Flow      Pain Score 8     Pain Loc      Pain Edu?      Excl. in [place]?     Constitutional: Alert and oriented. Well appearing and in no acute  distress. Answers questions appropriately. Eyes: Conjunctivae are normal.  EOMI. No scleral icterus. Head: Atraumatic. Nose: No congestion/rhinnorhea. Mouth/Throat: Mucous membranes are moist.  Neck: No stridor.  Supple.   Cardiovascular: Normal rate, regular rhythm. No murmurs, rubs or gallops.  Respiratory: Normal respiratory effort.  No accessory muscle use or retractions. Lungs CTAB.  No wheezes, rales or ronchi. Gastrointestinal: Soft, nontender and nondistended.  No guarding or rebound.  No peritoneal signs. Musculoskeletal: No LE edema. No ttp in the calves or palpable cords.  Negative Homan's sign. Neurologic:  A&Ox3.  Speech is clear.  Face and smile are symmetric.  EOMI. PERRLA.  5 out of 5 grip strength, biceps and triceps, hip flexors, dorsiflexion and plantar flexion.  Normal sensation to light touch in the face, bilateral upper extremities and lower extremities.  Skin:  Skin is warm, dry and intact. No rash noted. Psychiatric: Mood and affect are normal. Speech and behavior are normal.  Normal judgement.  ____________________________________________   LABS (all labs ordered are listed, but only abnormal results are displayed)  Labs Reviewed  BASIC METABOLIC PANEL - Abnormal; Notable for the following components:      Result Value   Potassium 3.4 (*)    CO2 20 (*)    Glucose, Bld 146 (*)    All other components within normal limits  CBC - Abnormal; Notable for the following components:   WBC 3.3 (*)    All other components within normal limits  TROPONIN I  TROPONIN I  POC URINE PREG, ED   ____________________________________________  EKG  ED ECG REPORT I, Rockne MenghiniNorman, Anne-Caroline, the attending physician, personally viewed and interpreted this ECG.   Date: 01/25/2017  EKG Time: 1524  Rate: 64  Rhythm: normal sinus rhythm  Axis: normal  Intervals:none  ST&T Change: No STEMI  ____________________________________________  RADIOLOGY  Dg Chest 2 View  Result  Date: 01/25/2017 CLINICAL DATA:  Chest pain EXAM: CHEST  2 VIEW COMPARISON:  None. FINDINGS: The heart size and mediastinal contours are within normal limits. Both lungs are clear. The visualized skeletal structures are unremarkable. IMPRESSION: No active cardiopulmonary disease. Electronically Signed   By: Alcide CleverMark  Lukens M.D.   On: 01/25/2017 16:02   Ct Head Wo Contrast  Result Date: 01/25/2017 CLINICAL DATA:  Left chest pain and left arm numbness/ pain. EXAM: CT HEAD WITHOUT CONTRAST TECHNIQUE: Contiguous axial images were obtained from the base of the skull through the vertex without intravenous contrast. COMPARISON:  June 10, 2016 FINDINGS: Brain: No evidence of acute infarction, hemorrhage, hydrocephalus, extra-axial collection or mass lesion/mass effect. Vascular: No hyperdense vessel or unexpected calcification. Skull: Normal. Negative for fracture or focal lesion. Sinuses/Orbits: No acute finding. Other: None. IMPRESSION: Normal brain. Electronically Signed   By: Gerome Samavid  Williams III M.D   On: 01/25/2017 18:43    ____________________________________________   PROCEDURES  Procedure(s) performed: None  Procedures  Critical Care performed: No ____________________________________________   INITIAL IMPRESSION / ASSESSMENT AND PLAN / ED COURSE  Pertinent labs & imaging results that were available during my care of the patient were reviewed by me and considered in my medical decision making (see chart for details).  26 y.o. female with a history of diabetes, hypertension, ongoing tobacco abuse presenting with an episode of chest pain associated with left upper extremity numbness.  Overall, the patient is hemodynamically stable and has no focal neurologic or cardiopulmonary findings on examination.  I do not see signs of acute stroke, but given her multiple chronic illnesses, will get a CT scan for further evaluation.  It is unlikely that she has ACS or MI, and I do not see ischemic changes on  her EKG, her first troponin is negative.  We will get a second troponin.  At this time, the patient is having ongoing chest tightness, so we will treat her with aspirin and nitroglycerin and reevaluate her symptoms.  It is possible that she may have esophageal spasm, or a GI cause for her symptoms.  Plan reevaluation for final disposition.  ----------------------------------------- 5:02 PM on 01/25/2017 -----------------------------------------  At this time, the patient continues to have 7 out of 10 pain after 2 doses of nitroglycerin, she has had no change.  We will try 1 more dose, if this does not work, we will try a GI cocktail.  ----------------------------------------- 6:48 PM on 01/25/2017 -----------------------------------------  The patient's workup in the emergency department has been reassuring.  She has mild hypokalemia, which has been supplemented here.  She has 2 troponins which are negative and an EKG which does not show any ischemic changes or arrhythmias.  Her chest x-ray does not show any acute cardiopulmonary process.  She has a CT of the head which does not show any acute intracranial process.  Clinically, the patient had no change in her pain with nitroglycerin, but did feel better with a GI cocktail.  At this time, the patient is asymptomatic.  We will plan to discharge the patient home with close PMD follow-up as well as cardiology follow-up.  Return precautions as well as follow-up instructions were discussed.  ____________________________________________  FINAL CLINICAL IMPRESSION(S) / ED DIAGNOSES  Final diagnoses:  Chest pain, unspecified type  Left arm numbness         NEW MEDICATIONS STARTED DURING THIS VISIT:  This SmartLink is deprecated. Use AVSMEDLIST instead to display the medication list for a patient.    Rockne Menghini, MD 01/25/17 1849    Rockne Menghini, MD 01/25/17 [phone]

## 2017-01-25 NOTE — ED Triage Notes (Signed)
Patient presents to ED via POV from home with c/o left chest pain and left arm numbness/pain. Patient states she was getting dressed when the pain hit. Patient denies SOB, nausea or vomiting.

## 2017-01-25 NOTE — ED Notes (Signed)
Final BP after 3 doses of SL nitro: 114/75 CP after 3 doses of SL nitro: 7/10  MD made aware.

## 2017-04-04 ENCOUNTER — Other Ambulatory Visit: Payer: Self-pay

## 2017-04-04 ENCOUNTER — Ambulatory Visit: Payer: Self-pay

## 2017-04-04 DIAGNOSIS — E1159 Type 2 diabetes mellitus with other circulatory complications: Secondary | ICD-10-CM

## 2017-04-04 DIAGNOSIS — I10 Essential (primary) hypertension: Principal | ICD-10-CM

## 2017-04-04 MED ORDER — AMLODIPINE BESYLATE 10 MG PO TABS: 10.0000 mg | ORAL_TABLET | Freq: Every day | ORAL | 5 refills | Status: AC

## 2017-04-04 NOTE — Telephone Encounter (Signed)
Pt called to r/s appt for today. Also requested a refill for amlodipine. Sent new rx to pharmacy

## 2017-04-22 ENCOUNTER — Encounter: Payer: Self-pay | Admitting: Intensive Care

## 2017-04-22 ENCOUNTER — Other Ambulatory Visit: Payer: Self-pay

## 2017-04-22 ENCOUNTER — Emergency Department
Admission: EM | Admit: 2017-04-22 | Discharge: 2017-04-22 | Disposition: A | Discharge status: Home / Self Care | Payer: Self-pay | Attending: Emergency Medicine | Admitting: Emergency Medicine

## 2017-04-22 DIAGNOSIS — E119 Type 2 diabetes mellitus without complications: Secondary | ICD-10-CM | POA: Insufficient documentation

## 2017-04-22 DIAGNOSIS — I10 Essential (primary) hypertension: Secondary | ICD-10-CM | POA: Insufficient documentation

## 2017-04-22 DIAGNOSIS — R05 Cough: Secondary | ICD-10-CM | POA: Insufficient documentation

## 2017-04-22 DIAGNOSIS — J069 Acute upper respiratory infection, unspecified: Secondary | ICD-10-CM | POA: Insufficient documentation

## 2017-04-22 DIAGNOSIS — B9789 Other viral agents as the cause of diseases classified elsewhere: Secondary | ICD-10-CM

## 2017-04-22 DIAGNOSIS — Z79899 Other long term (current) drug therapy: Secondary | ICD-10-CM | POA: Insufficient documentation

## 2017-04-22 DIAGNOSIS — F17290 Nicotine dependence, other tobacco product, uncomplicated: Secondary | ICD-10-CM | POA: Insufficient documentation

## 2017-04-22 MED ORDER — PSEUDOEPH-BROMPHEN-DM 30-2-10 MG/5ML PO SYRP: 5.0000 mL | ORAL_SOLUTION | Freq: Four times a day (QID) | ORAL | 0 refills | Status: AC | PRN

## 2017-04-22 NOTE — ED Provider Notes (Signed)
[facility] Emergency Department Provider Note  ____________________________________________   First MD Initiated Contact with Patient 04/22/17 [phone]     (approximate)  I have reviewed the triage vital signs and the nursing notes.   HISTORY  Chief Complaint No chief complaint on file.   HPI Pamela Kemp is a 27 y.o. female is here with complaint of congestion and headache for 2 days.  Patient has been taking Tylenol without any relief of her symptoms.  Patient denies any other symptoms such as nausea, vomiting or diarrhea.  There is no complaints of body aches.  Past Medical History:  Diagnosis Date  . Gastroesophageal reflux disease without esophagitis 03/13/2016  . Hypertension   . Morbid obesity ([place]) 04/03/2016  . Tobacco use 04/08/2016  . Type 2 diabetes mellitus without complication, with long-term current use of insulin ([place]) 02/16/2016    Patient Active Problem List   Diagnosis Date Noted  . Tobacco use 04/08/2016  . Morbid obesity ([place]) 04/03/2016  . Bilateral leg and foot pain 04/03/2016  . Pes planus 04/03/2016  . Benign hypertension 04/03/2016  . Gastroesophageal reflux disease without esophagitis 03/13/2016  . Diabetes mellitus without complication ([place]) 18/84/1660    History reviewed. No pertinent surgical history.  Prior to Admission medications   Medication Sig Start Date End Date Taking? Authorizing Provider  amLODipine (NORVASC) 10 MG tablet Take 1 tablet (10 mg total) by mouth daily. 04/04/17   Doles-Johnson, Teah, NP  blood glucose meter kit and supplies KIT Dispense based on patient and insurance preference. Use one time daily as directed.; E11.9 04/03/16   Lada, Satira Anis, MD  brompheniramine-pseudoephedrine-DM 30-2-10 MG/5ML syrup Take 5 mLs by mouth 4 (four) times daily as needed. 04/22/17   Johnn Hai, PA-C  metoprolol succinate (TOPROL XL) 25 MG 24 hr tablet Take 1 tablet (25 mg total) by mouth daily. 11/22/16    Doles-Johnson, Teah, NP    Allergies Amoxicillin  Family History  Problem Relation Age of Onset  . Hypertension Mother   . Diabetes Mother   . Multiple sclerosis Father     Social History Social History   Tobacco Use  . Smoking status: Current Every Day Smoker    Years: 8.00    Types: Cigars  . Smokeless tobacco: Never Used  . Tobacco comment: 2 cigars per day  Substance Use Topics  . Alcohol use: Yes    Alcohol/week: 0.6 oz    Types: 1 Standard drinks or equivalent per week    Comment: wine coolers  . Drug use: Yes    Types: Marijuana    Comment: occassionally    Review of Systems Constitutional: No fever/chills Eyes: No visual changes. ENT: Positive for nasal congestion. Cardiovascular: Denies chest pain. Respiratory: Denies shortness of breath.  Positive occasional cough. Gastrointestinal: No abdominal pain.  No nausea, no vomiting.  No diarrhea.  Musculoskeletal: Negative for back pain. Skin: Negative for rash. Neurological: Positive for headaches, for focal weakness or numbness. ___________________________________________   PHYSICAL EXAM:  VITAL SIGNS: ED Triage Vitals [04/22/17 0847]  Enc Vitals Group     BP 137/87     Pulse Rate 82     Resp 16     Temp 98.1 F (36.7 C)     Temp Source Oral     SpO2 99 %     Weight (!) 340 lb (154.2 kg)     Height 5' 8"  (1.727 m)     Head Circumference  Peak Flow      Pain Score 8     Pain Loc      Pain Edu?      Excl. in [place]?    Constitutional: Alert and oriented. Well appearing and in no acute distress. Eyes: Conjunctivae are normal.  Head: Atraumatic. Nose: Moderate congestion/rhinnorhea.  TMs are dull bilaterally. Mouth/Throat: Mucous membranes are moist.  Oropharynx non-erythematous. Neck: No stridor.   Hematological/Lymphatic/Immunilogical: No cervical lymphadenopathy. Cardiovascular: Normal rate, regular rhythm. Grossly normal heart sounds.  Good peripheral circulation. Respiratory: Normal  respiratory effort.  No retractions. Lungs CTAB. Musculoskeletal: Moves upper and lower extremities without any difficulty and normal gait was noted. Neurologic:  Normal speech and language. No gross focal neurologic deficits are appreciated. No gait instability. Skin:  Skin is warm, dry and intact. No rash noted. Psychiatric: Mood and affect are normal. Speech and behavior are normal.  ____________________________________________   LABS (all labs ordered are listed, but only abnormal results are displayed)  Labs Reviewed - No data to display  PROCEDURES  Procedure(s) performed: None  Procedures  Critical Care performed: No  ____________________________________________   INITIAL IMPRESSION / ASSESSMENT AND PLAN / ED COURSE  Patient was made aware that most likely this is a viral upper respiratory infection and was discharged with prescription for Bromfed-DM as needed for cough and congestion.  She is encouraged to drink fluids and take Tylenol or ibuprofen if needed for fever.  She is to follow-up with her PCP if any continued problems.  ____________________________________________   FINAL CLINICAL IMPRESSION(S) / ED DIAGNOSES  Final diagnoses:  Viral URI with cough     ED Discharge Orders        Ordered    brompheniramine-pseudoephedrine-DM 30-2-10 MG/5ML syrup  4 times daily PRN     04/22/17 1002       Note:  This document was prepared using Dragon voice recognition software and may include unintentional dictation errors.    Johnn Hai, PA-C 04/22/17 1339    Schuyler Amor, MD 04/22/17 1447

## 2017-04-22 NOTE — Discharge Instructions (Signed)
[facility] problems.  Increase fluids.  Begin taking Bromfed-DM as needed for cough and congestion.  Continue with Tylenol or ibuprofen as needed for fever or body aches.

## 2017-04-22 NOTE — ED Triage Notes (Signed)
Patient c/o congestion and headache X2 days. Reports last taking tylenol last night

## 2017-04-23 ENCOUNTER — Ambulatory Visit: Payer: Medicaid Other | Admitting: Urology

## 2017-04-23 VITALS — BP 142/88 | HR 99 | Temp 97.9°F | Wt 328.8 lb

## 2017-04-23 DIAGNOSIS — E119 Type 2 diabetes mellitus without complications: Secondary | ICD-10-CM

## 2017-04-23 LAB — GLUCOSE, POCT (MANUAL RESULT ENTRY): POC Glucose: 98 mg/dl (ref 70–99)

## 2017-04-23 MED ORDER — MELOXICAM 15 MG PO TABS: 15.0000 mg | ORAL_TABLET | Freq: Every day | ORAL | 1 refills | Status: DC

## 2017-04-23 NOTE — Progress Notes (Signed)
   Subjective:    Patient ID: Pamela Kemp, female    DOB: 11-27-1990, 27 y.o.   MRN: [id number]  HPI  Pamela Kemp is a 27 yo female here for generalized pain from arthritis. She reports constant pain all day. 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 She reports avoiding heavy lifting. She uses IcyHot which provides temporary relief.  She denies taking Tylenol.  She was seen in the ED yesterday for Viral URI.  Hypertension: BP is elevated tonight at 142/88. Pt reports she is taking meds as rx.   Patient Active Problem List   Diagnosis Date Noted  . Tobacco use 04/08/2016  . Morbid obesity ([place]) 04/03/2016  . Bilateral leg and foot pain 04/03/2016  . Pes planus 04/03/2016  . Benign hypertension 04/03/2016  . Gastroesophageal reflux disease without esophagitis 03/13/2016  . Diabetes mellitus without complication ([place]) 16/60/6301   Allergies as of 04/23/2017      Reactions   Amoxicillin Rash   rash      Medication List        Accurate as of 04/23/17  8:23 PM. Always use your most recent med list.          amLODipine 10 MG tablet Commonly known as:  NORVASC Take 1 tablet (10 mg total) by mouth daily.   blood glucose meter kit and supplies Kit Dispense based on patient and insurance preference. Use one time daily as directed.; E11.9   brompheniramine-pseudoephedrine-DM 30-2-10 MG/5ML syrup Take 5 mLs by mouth 4 (four) times daily as needed.   ibuprofen 800 MG tablet Commonly known as:  ADVIL,MOTRIN Take 800 mg by mouth every 8 (eight) hours as needed.   metoprolol succinate 25 MG 24 hr tablet Commonly known as:  TOPROL XL Take 1 tablet (25 mg total) by mouth daily.        Review of Systems     Objective:   Physical Exam  Constitutional: She is oriented to person, place, and time. She appears well-developed and well-nourished.  Neurological: She is alert and oriented to person, place, and time.  Vitals  reviewed.   BP (!) 142/88   Pulse 99   Temp 97.9 F (36.6 C)   Wt (!) 328 lb 12.8 oz (149.1 kg)   BMI 49.99 kg/m        Assessment & Plan:   Arthritis pain: referral to Dr. Vickki Hearing and referral to [facility] PT clinic. Rx Mobic 49m daily. Advised Pt to take Tylenol for breakthrough pain and encouraged constant movement.   F/u PRN

## 2017-04-29 ENCOUNTER — Telehealth: Payer: Self-pay | Admitting: Pharmacy Technician

## 2017-04-29 NOTE — Telephone Encounter (Signed)
Patient failed to provide 2019 financial documentation.  No additional medication assistance will be provided by [facility] without the required proof of income documentation.  Patient notified by letter.  [name] J. Indria Bishara Care Manager [facility] 

## 2017-05-15 ENCOUNTER — Ambulatory Visit: Payer: Medicaid Other | Admitting: Specialist

## 2017-05-15 DIAGNOSIS — M25562 Pain in left knee: Principal | ICD-10-CM

## 2017-05-15 DIAGNOSIS — G8929 Other chronic pain: Secondary | ICD-10-CM

## 2017-05-15 DIAGNOSIS — M25561 Pain in right knee: Principal | ICD-10-CM

## 2017-05-15 NOTE — Progress Notes (Signed)
   Subjective:    Patient ID: Pamela Kemp, female    DOB: 1990-04-19, 27 y.o.   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 Has a 3 yr history of multiple arthralgias. No hx of trauma. Mainly involves hands, knees, and low back. No previous treatment. She has not made it to PT; the Mobic helps some days.       Review of Systems     Objective:   Physical Exam  She is morbidly obese and appears markedly deconditioned. There is a FROM of shoulders, elbows, wrists, and fingers. On inspection, there is no obvious joint swelling. There are no trophic changes of the skin or nails. There is a neg Grind test. There is a neg Finkelstein's test Bil.    Knee: She has Bil Genu Valgus. Her gait is deliberate but not antalgic. She declines to walk on her toes or heels or do a squat. Her pain is anterior. In the supine position, there is no effusion. She has bil recurvatum. She had pain with passive flexion at 60 degrees. The pain was in her distal thigh on the left. On her right she has 100 degrees of flexion. There is no M/L laxity.       Assessment & Plan:  Multiple complaints of pain. Her subjective complaints outweigh any objective findings today. I would attribute a lot of this to her weight and to me she appears somewhat depressed. I am going to be more specific with the PT referral. She can return here PRN.

## 2017-10-16 ENCOUNTER — Telehealth: Payer: Self-pay | Admitting: Pharmacy Technician

## 2017-10-16 ENCOUNTER — Encounter: Payer: Self-pay | Admitting: Pharmacy Technician

## 2017-10-16 NOTE — Telephone Encounter (Signed)
Received updated proof of income.  Patient eligible to receive medication assistance at [facility] as long as eligibility requirements continue to be met.  Christos Mixson J. Dante Roudebush Care Manager [facility] 

## 2017-10-23 ENCOUNTER — Other Ambulatory Visit: Payer: Self-pay | Admitting: Internal Medicine

## 2018-10-01 ENCOUNTER — Telehealth: Payer: Self-pay | Admitting: Pharmacy Technician

## 2018-10-01 NOTE — Telephone Encounter (Signed)
Patient failed to provide2020 financial documentation. No additional medication assistance will be provided by [facility] without the required proof of income documentation. Patient notified by letter.  Ashantia Amaral, CPhT [facility] 

## 2019-03-27 IMAGING — CT CT HEAD W/O CM
3 series · 15 of 47 positions shown, 18 images · non-contrast
Comparison: None.

CLINICAL DATA: Three days of headaches

EXAM:
CT HEAD WITHOUT CONTRAST
TECHNIQUE: Contiguous axial images were obtained from the base of the skull
through the vertex without intravenous contrast.

[Series 2: head wo · axial · 0.43mm/px · z∈[+344,+474]mm · 9 of 32 slices shown, 12 images]
[im 3/32  brain]
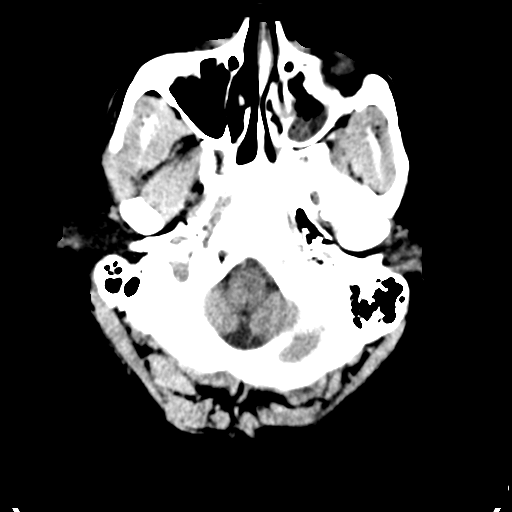
[im 3/32  bone]
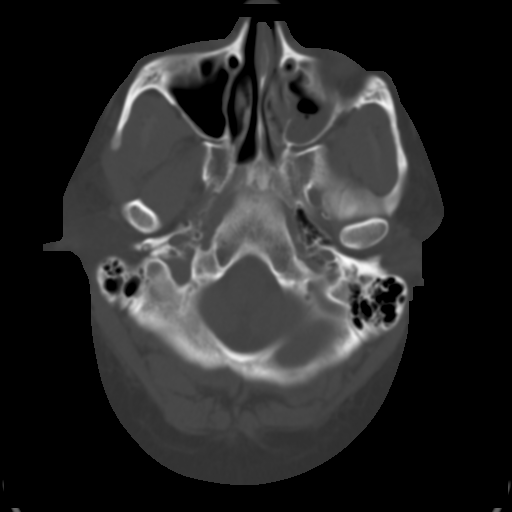
[im 6/32  brain]
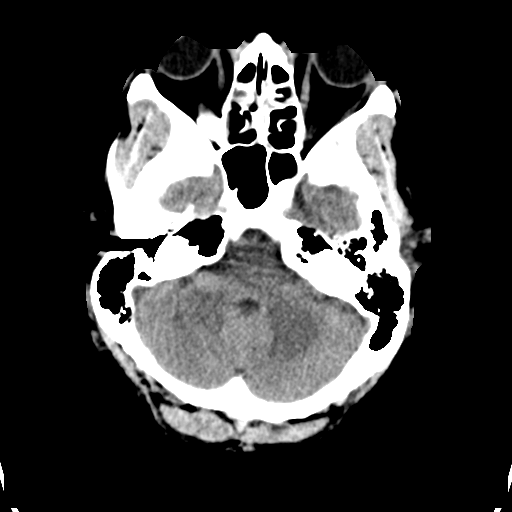
[im 9/32  brain]
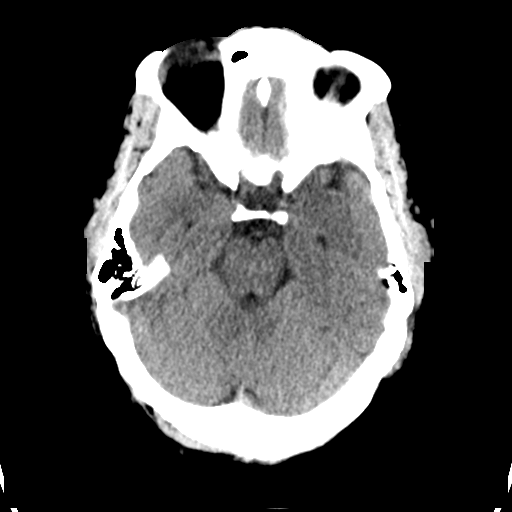
[im 12/32  brain]
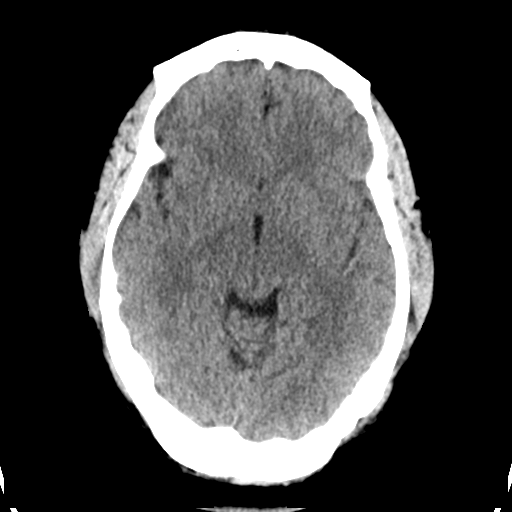
[im 17/32  brain]
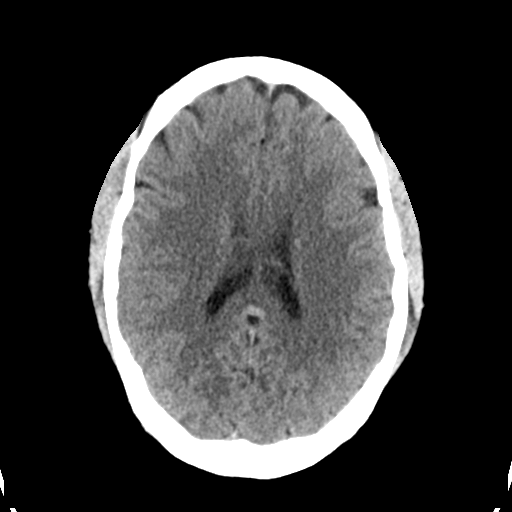
[im 17/32  bone]
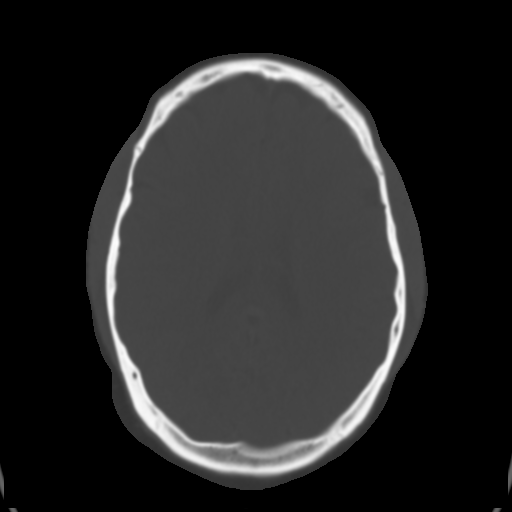
[im 20/32  brain]
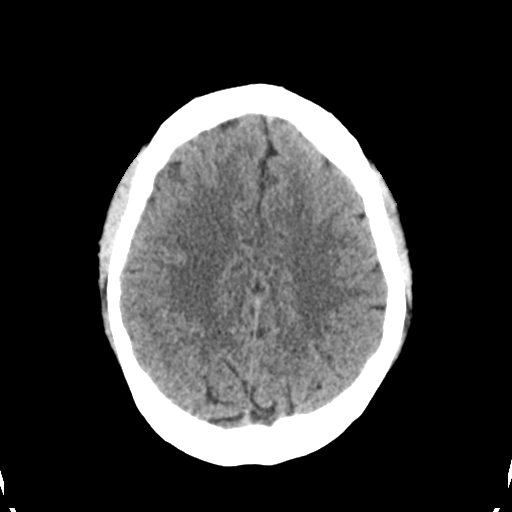
[im 23/32  brain]
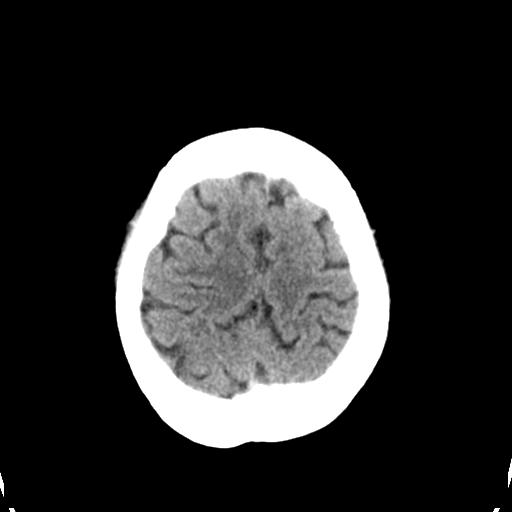
[im 26/32  brain]
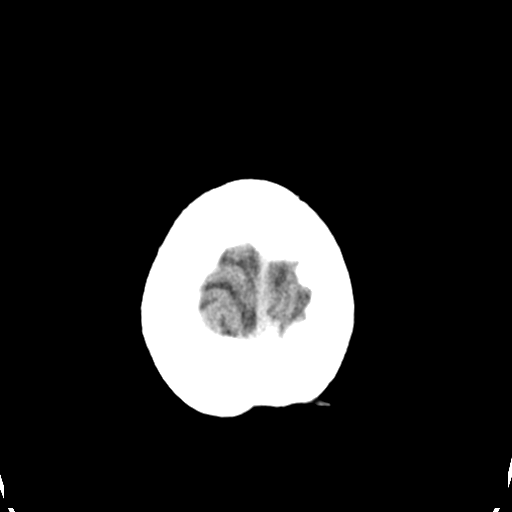
[im 29/32  brain]
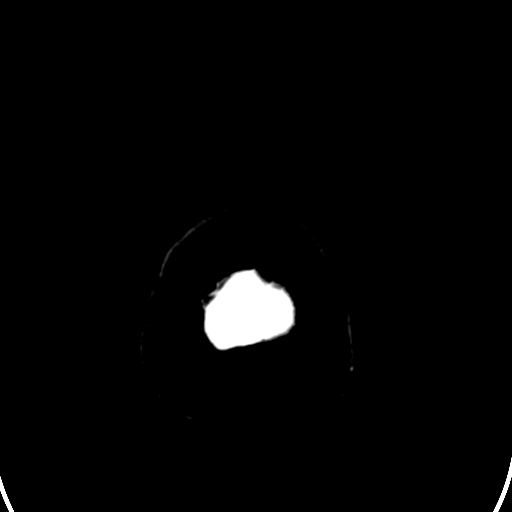
[im 29/32  bone]
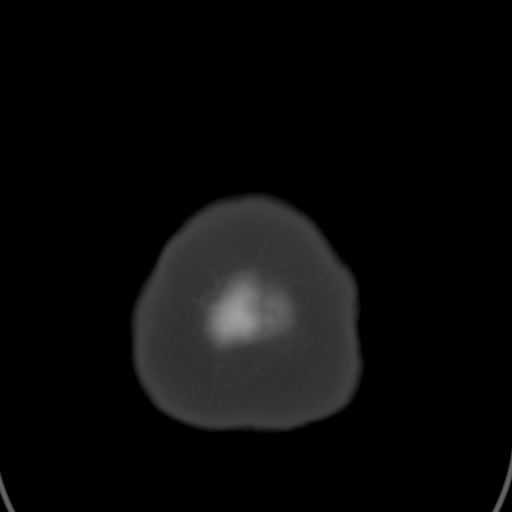

[Series 4: coronal soft tissue · coronal · 0.29mm/px · 3 of 67 slices shown]
[im 23/67  brain]
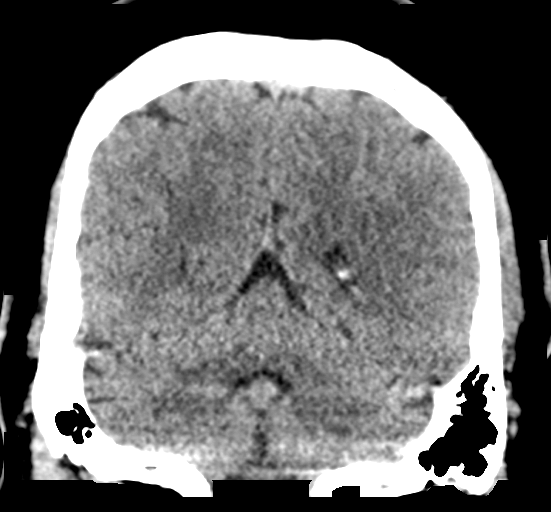
[im 30/67  brain]
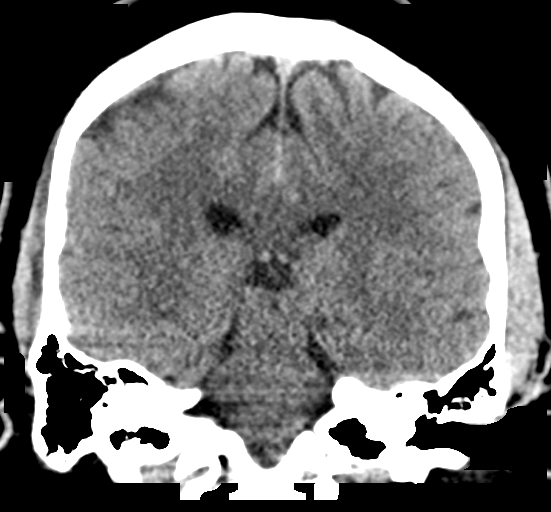
[im 37/67  brain]
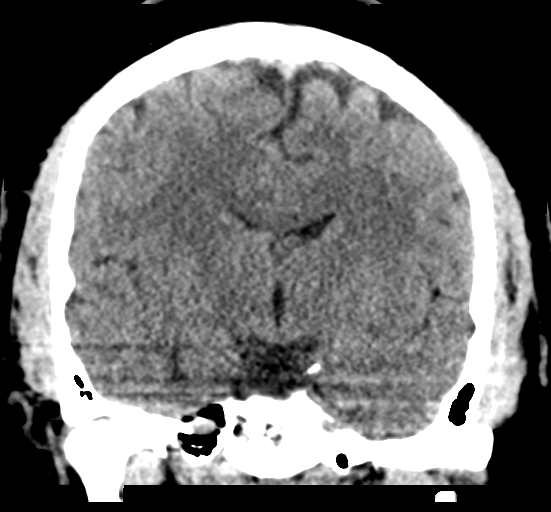

[Series 5: sagittal soft tissue · sagittal · 0.29mm/px · 3 of 53 slices shown]
[im 18/53  brain]
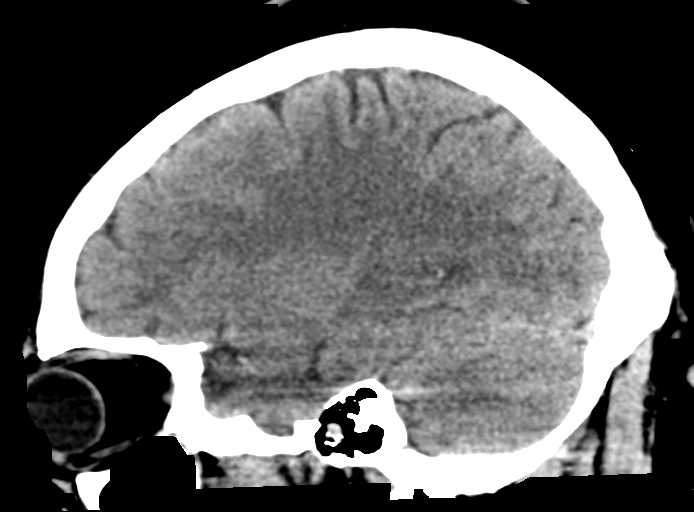
[im 27/53  brain]
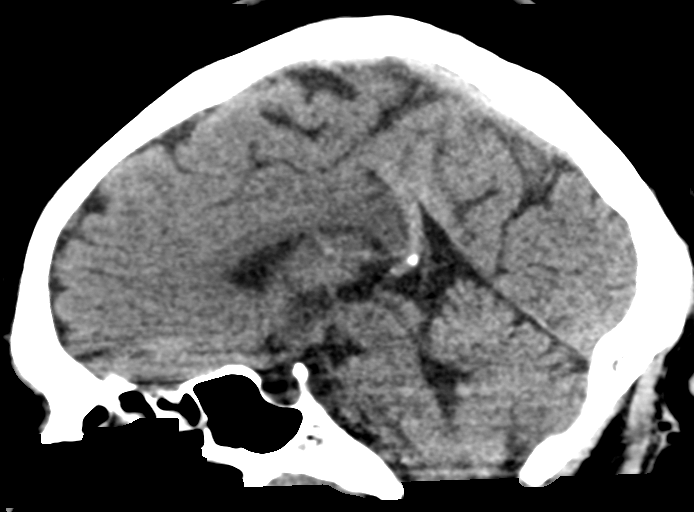
[im 35/53  brain]
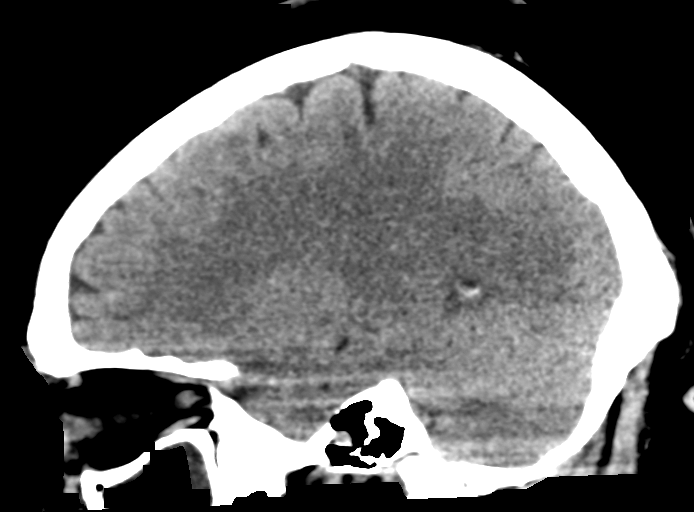

[15 of 47 positions shown; findings below may reference images not displayed]

FINDINGS: Brain: No evidence of acute infarction, hemorrhage, hydrocephalus,
extra-axial collection or mass lesion/mass effect.

Vascular: No hyperdense vessel or unexpected calcification.

Skull: Negative for acute fracture or focal lesions.

Sinuses/Orbits: Moderate ethmoid and left maxillary sinus mucosal
thickening with mild mucosal thickening of the right maxillary sinus
and sphenoid. The frontal sinus is developmentally not pneumatized.
Clear mastoids bilaterally.

Other: None.
IMPRESSION: Chronic appearing paranasal sinusitis. No acute intracranial
abnormality.
